# Patient Record
Sex: Male | Born: 1955 | Race: Black or African American | Hispanic: No | Marital: Married | Smoking: Never smoker
Health system: Southern US, Community
[De-identification: ages and names within clinical notes are randomized; demographics above are authoritative.]

## PROBLEM LIST (undated history)

## (undated) DIAGNOSIS — I10 Essential (primary) hypertension: Secondary | ICD-10-CM

## (undated) DIAGNOSIS — E785 Hyperlipidemia, unspecified: Secondary | ICD-10-CM

## (undated) HISTORY — DX: Hyperlipidemia, unspecified: E78.5

## (undated) HISTORY — DX: Essential (primary) hypertension: I10

## (undated) HISTORY — PX: NO PAST SURGERIES: SHX2092

---

## 2004-12-06 ENCOUNTER — Ambulatory Visit: Payer: Self-pay | Admitting: Family Medicine

## 2007-01-27 IMAGING — CR DG LUMBAR SPINE AP/LAT/OBLIQUES W/ FLEX AND EXT
1 series · 5 of 5 positions shown · non-contrast
Comparison: none

REASON FOR EXAM: backache
COMMENTS:

[Series 1: view not recorded · 0.17mm/px · 5 of 5 slices shown]
[im 1/5]
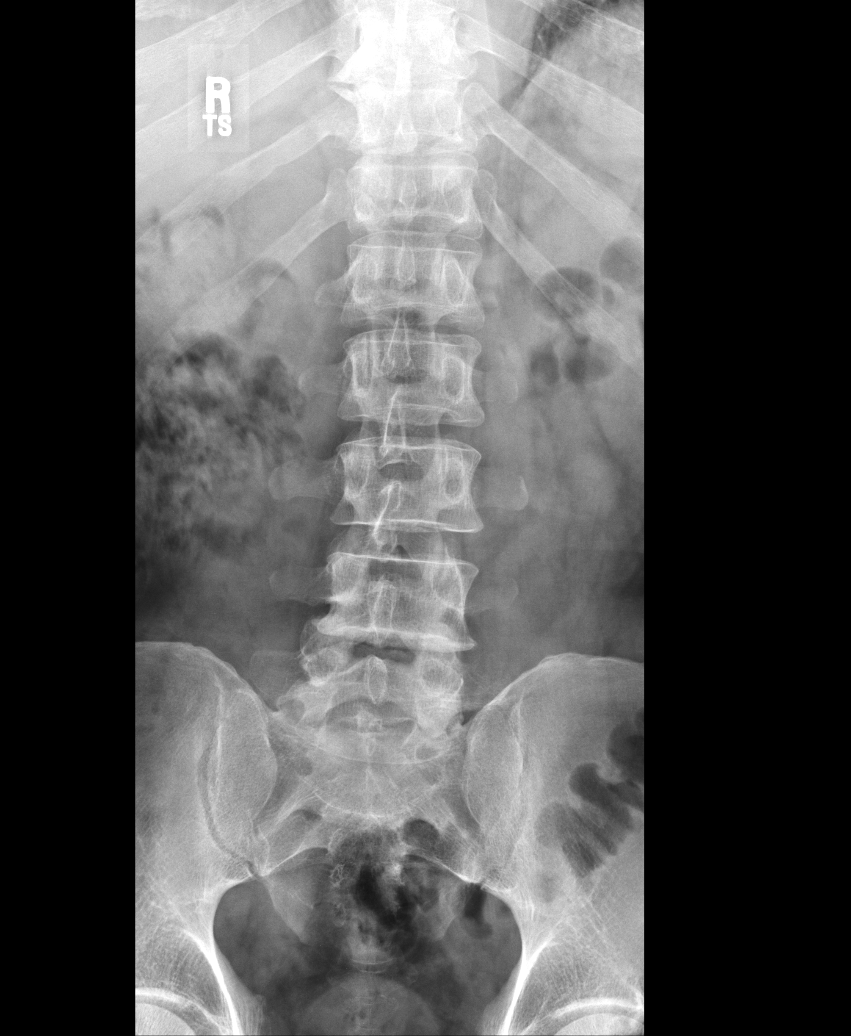
[im 2/5]
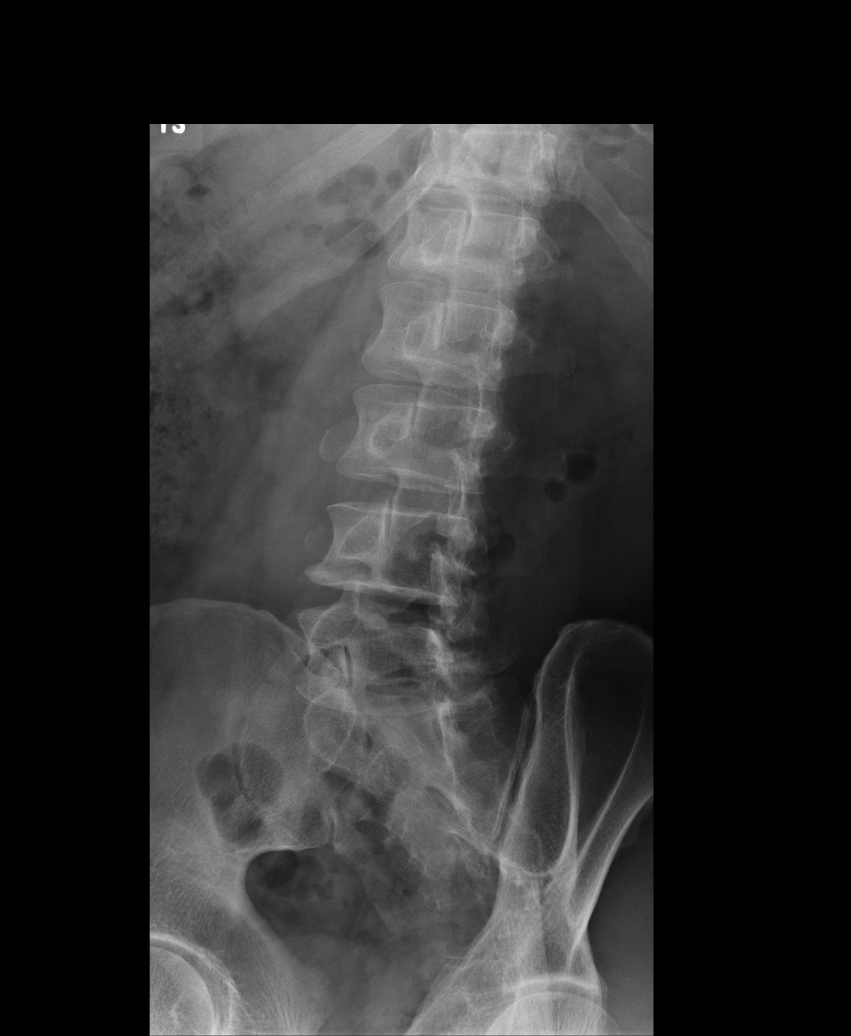
[im 3/5]
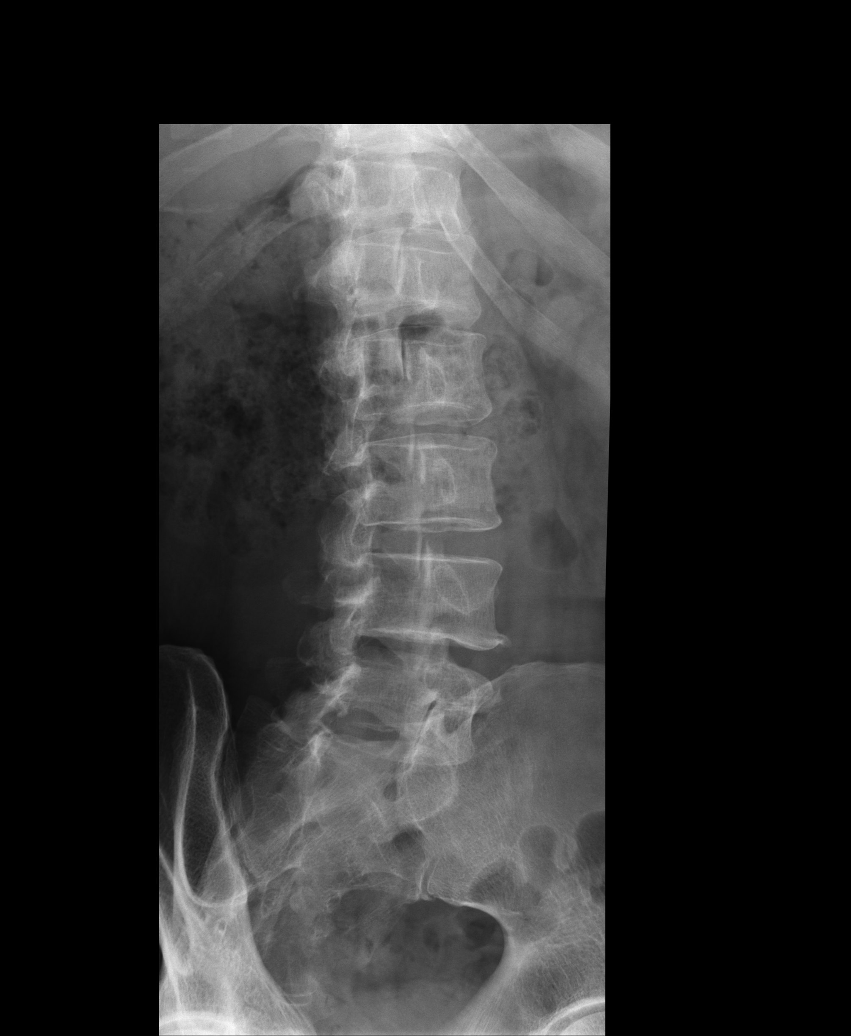
[im 4/5]
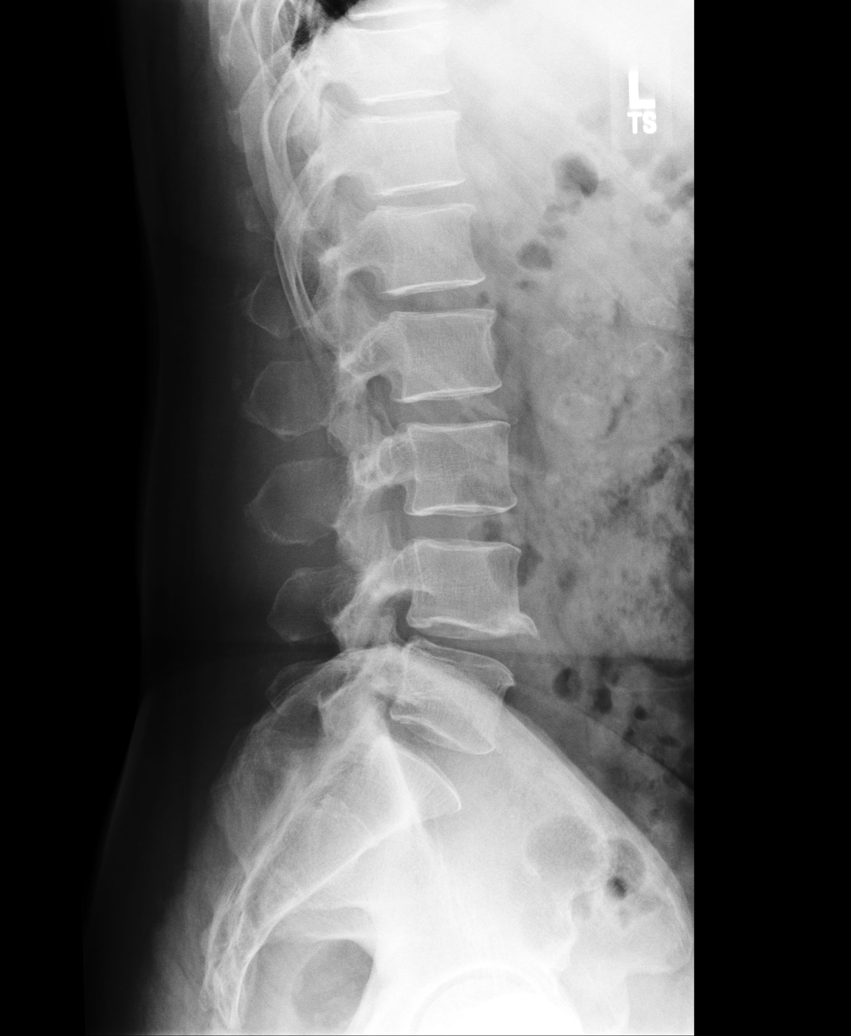
[im 5/5]
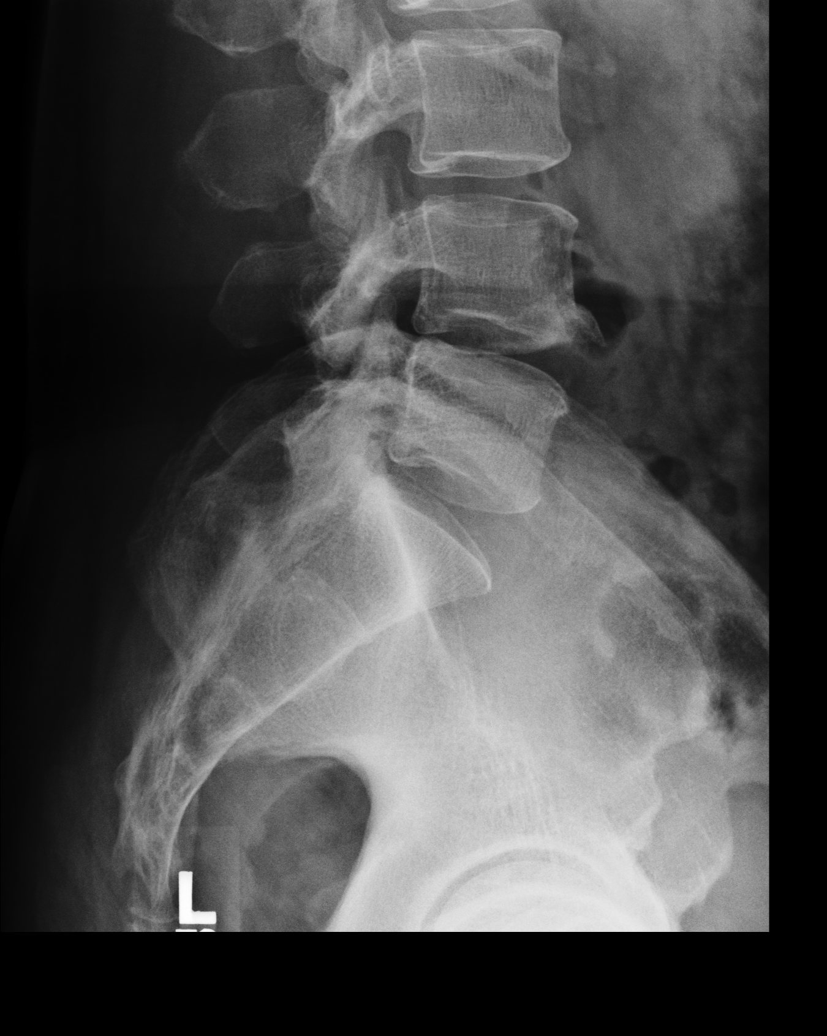

[5 of 5 positions shown; findings below may reference images not displayed]

PROCEDURE:     DXR - DXR LUMBAR SPINE WITH OBLIQUES  - December 06, 2004  [DATE]

RESULT:        AP, lateral and oblique views of the lumbar spine show the
vertebral body heights to be well maintained.  The vertebral body alignment
is normal.  There is mild narrowing of the L4-L5 intervertebral disc space
compatible with disc disease.  Degenerative spurring is noted anteriorly at
L4 and L5.  Oblique view show no significant abnormalities of the articular
facets.  The pedicles are bilaterally intact.
IMPRESSION: 1.     No fracture is seen.
2.     There is narrowing of the L4-L5 intervertebral disc space consistent
with disc disease.  This could be further evaluated by MR if clinically
indicated.

## 2015-05-01 ENCOUNTER — Other Ambulatory Visit: Payer: Self-pay | Admitting: Family Medicine

## 2015-05-02 ENCOUNTER — Encounter: Payer: Self-pay | Admitting: Family Medicine

## 2015-05-02 ENCOUNTER — Ambulatory Visit (INDEPENDENT_AMBULATORY_CARE_PROVIDER_SITE_OTHER): Payer: BLUE CROSS/BLUE SHIELD | Admitting: Family Medicine

## 2015-05-02 VITALS — BP 142/98 | HR 76 | Temp 98.8°F | Resp 18 | Ht 70.0 in | Wt 280.2 lb

## 2015-05-02 DIAGNOSIS — G4733 Obstructive sleep apnea (adult) (pediatric): Secondary | ICD-10-CM | POA: Insufficient documentation

## 2015-05-02 DIAGNOSIS — E785 Hyperlipidemia, unspecified: Secondary | ICD-10-CM

## 2015-05-02 DIAGNOSIS — I10 Essential (primary) hypertension: Secondary | ICD-10-CM

## 2015-05-02 DIAGNOSIS — R7303 Prediabetes: Secondary | ICD-10-CM | POA: Insufficient documentation

## 2015-05-02 DIAGNOSIS — R739 Hyperglycemia, unspecified: Secondary | ICD-10-CM | POA: Diagnosis not present

## 2015-05-02 DIAGNOSIS — Z23 Encounter for immunization: Secondary | ICD-10-CM | POA: Diagnosis not present

## 2015-05-02 LAB — POCT GLYCOSYLATED HEMOGLOBIN (HGB A1C): Hemoglobin A1C: 5.9

## 2015-05-02 LAB — GLUCOSE, POCT (MANUAL RESULT ENTRY): POC GLUCOSE: 93 mg/dL (ref 70–99)

## 2015-05-02 NOTE — Progress Notes (Signed)
10 mgName: Tristan Miller   MRN: [id number]    DOB: 02/02/1956   Date:05/02/2015       Progress Note  Subjective  Chief Complaint  Chief Complaint  Patient presents with  . Hypertension  . Hyperlipidemia    HPI  Hypertension   Patient presents for follow-up of hypertension. It has been present for over 5  years.  Patient states that there is compliance with medical regimen which consists of Hyzaar 100-25 daily . There is no end organ disease. Cardiac risk factors include hypertension hyperlipidemia and diabetes.  Exercise regimen consist of aerobic and weight lifting intermittently .  Diet consist of some salt restriction .  Hyperlipidemia  Patient has a history of hyperlipidemia for over 5 years.  Current medical regimen consist of lovastatin 40 mg daily at bedtime .  Compliance is fair .  Diet and exercise are currently followed intermittently .  Risk factors for cardiovascular disease include hyperlipidemia and hypertension obesity .   There have been no side effects from the medication.     Obstructive sleep apnea  Several years he has had a history of loud snoring and intermittent stopping of his breathing  by his wife's report. He admits to some daytime somnolence difficulty with concentration and headaches. He often gasps awakens himself at night as well.  Past Medical History  Diagnosis Date  . Hyperlipidemia   . Hypertension     Social History  Substance Use Topics  . Smoking status: Never Smoker   . Smokeless tobacco: Not on file  . Alcohol Use: No     Comment: quit 30 years ago     Current outpatient prescriptions:  .  losartan-hydrochlorothiazide (HYZAAR) 100-25 MG tablet, Take 1 tablet by mouth daily., Disp: , Rfl:  .  lovastatin (MEVACOR) 40 MG tablet, Take 40 mg by mouth at bedtime., Disp: , Rfl:   No Known Allergies  Review of Systems  Constitutional: Negative for fever, chills and weight loss.  HENT: Negative for congestion, hearing loss, sore  throat and tinnitus.   Eyes: Negative for blurred vision, double vision and redness.  Respiratory: Negative for cough, hemoptysis and shortness of breath.   Cardiovascular: Negative for chest pain, palpitations, orthopnea, claudication and leg swelling.  Gastrointestinal: Negative for heartburn, nausea, vomiting, diarrhea, constipation and blood in stool.  Genitourinary: Negative for dysuria, urgency, frequency and hematuria.  Musculoskeletal: Negative for myalgias, back pain, joint pain, falls and neck pain.  Skin: Negative for itching.  Neurological: Negative for dizziness, tingling, tremors, focal weakness, seizures, loss of consciousness, weakness and headaches.  Endo/Heme/Allergies: Does not bruise/bleed easily.  Psychiatric/Behavioral: Negative for depression and substance abuse. The patient is not nervous/anxious and does not have insomnia.      Objective  Filed Vitals:   05/02/15 1211  BP: 142/98  Pulse: 76  Temp: 98.8 F (37.1 C)  TempSrc: Oral  Resp: 18  Height: 5\' 10"  (1.778 m)  Weight: 280 lb 3.2 oz (127.098 kg)  SpO2: 95%     Physical Exam  Constitutional: He is oriented to person, place, and time and well-developed, well-nourished, and in no distress.  Obese in no acute distress  HENT:  Head: Normocephalic.  Eyes: EOM are normal. Pupils are equal, round, and reactive to light.  Neck: Normal range of motion. Neck supple. No thyromegaly present.  Cardiovascular: Normal rate, regular rhythm and normal heart sounds.   No murmur heard. Pulmonary/Chest: Effort normal and breath sounds normal. No respiratory distress. He has  no wheezes.  Abdominal: Soft. Bowel sounds are normal.  Musculoskeletal: Normal range of motion. He exhibits edema (1+ in both lower extremities).  Lymphadenopathy:    He has no cervical adenopathy.  Neurological: He is alert and oriented to person, place, and time. No cranial nerve deficit. Gait normal. Coordination normal.  Skin: Skin is warm  and dry. No rash noted.  Psychiatric: Affect and judgment normal.      Assessment & Plan   1. Need for influenza vaccination Given - Flu Vaccine QUAD 36+ mos PF IM (Fluarix & Fluzone Quad PF)  2. OSA (obstructive sleep apnea) Stable - Ambulatory referral to Sleep Studies  3. Hyperglycemia Check glucose and A1c - POCT Glucose (CBG) - POCT HgB A1C  4. Essential hypertension Elevated today. A return visit will increase regimen does not come down with increased exercise and salt restriction - Comprehensive Metabolic Panel (CMET)  5. Hyperlipemia Labs - Comprehensive Metabolic Panel (CMET) - Lipid Profile - TSH

## 2015-05-02 NOTE — Patient Instructions (Signed)
Obesity Obesity is defined as having too much total body fat and a body mass index (BMI) of 30 or more. BMI is an estimate of body fat and is calculated from your height and weight. BMI is typically calculated by your health care provider during regular wellness visits. Obesity happens when you consume more calories than you can burn by exercising or performing daily physical tasks. Prolonged obesity can cause major illnesses or emergencies, such as:  Stroke.  Heart disease.  Diabetes.  Cancer.  Arthritis.  High blood pressure (hypertension).  High cholesterol.  Sleep apnea.  Erectile dysfunction.  Infertility problems. CAUSES   Regularly eating unhealthy foods.  Physical inactivity.  Certain disorders, such as an underactive thyroid (hypothyroidism), Cushing's syndrome, and polycystic ovarian syndrome.  Certain medicines, such as steroids, some depression medicines, and antipsychotics.  Genetics.  Lack of sleep. DIAGNOSIS A health care provider can diagnose obesity after calculating your BMI. Obesity will be diagnosed if your BMI is 30 or higher. There are other methods of measuring obesity levels. Some other methods include measuring your skinfold thickness, your waist circumference, and comparing your hip circumference to your waist circumference. TREATMENT  A healthy treatment program includes some or all of the following:  Long-term dietary changes.  Exercise and physical activity.  Behavioral and lifestyle changes.  Medicine only under the supervision of your health care provider. Medicines may help, but only if they are used with diet and exercise programs. If your BMI is 40 or higher, your health care provider may recommend specialized surgery or programs to help with weight loss. An unhealthy treatment program includes:  Fasting.  Fad diets.  Supplements and drugs. These choices do not succeed in long-term weight control. HOME CARE  INSTRUCTIONS  Exercise and perform physical activity as directed by your health care provider. To increase physical activity, try the following:  Use stairs instead of elevators.  Park farther away from store entrances.  Garden, bike, or walk instead of watching television or using the computer.  Eat healthy, low-calorie foods and drinks on a regular basis. Eat more fruits and vegetables. Use low-calorie cookbooks or take healthy cooking classes.  Limit fast food, sweets, and processed snack foods.  Eat smaller portions.  Keep a daily journal of everything you eat. There are many free websites to help you with this. It may be helpful to measure your foods so you can determine if you are eating the correct portion sizes.  Avoid drinking alcohol. Drink more water and drinks without calories.  Take vitamins and supplements only as recommended by your health care provider.  Weight-loss support groups, registered dietitians, counselors, and stress reduction education can also be very helpful. SEEK IMMEDIATE MEDICAL CARE IF:  You have chest pain or tightness.  You have trouble breathing or feel short of breath.  You have weakness or leg numbness.  You feel confused or have trouble talking.  You have sudden changes in your vision.   This information is not intended to replace advice given to you by your health care provider. Make sure you discuss any questions you have with your health care provider.   Document Released: 07/19/2004 Document Revised: 07/02/2014 Document Reviewed: 07/18/2011 Elsevier Interactive Patient Education 2016 [employer]  

## 2015-05-05 ENCOUNTER — Telehealth: Payer: Self-pay | Admitting: Family Medicine

## 2015-05-05 MED ORDER — LOSARTAN POTASSIUM-HCTZ 100-25 MG PO TABS: 1.0000 | ORAL_TABLET | Freq: Every day | ORAL | Status: DC

## 2015-05-05 NOTE — Telephone Encounter (Signed)
Script sent to pharmacy.

## 2015-05-05 NOTE — Telephone Encounter (Signed)
Kelly from [facility] is requesting a refill on Losartan. Patient was last seen on this past Monday.

## 2015-05-14 LAB — COMPREHENSIVE METABOLIC PANEL
A/G RATIO: 1.3 (ref 1.1–2.5)
ALT: 20 IU/L (ref 0–44)
AST: 23 IU/L (ref 0–40)
Albumin: 3.9 g/dL (ref 3.5–5.5)
Alkaline Phosphatase: 66 IU/L (ref 39–117)
BILIRUBIN TOTAL: 0.3 mg/dL (ref 0.0–1.2)
BUN/Creatinine Ratio: 13 (ref 9–20)
BUN: 13 mg/dL (ref 6–24)
CO2: 25 mmol/L (ref 18–29)
Calcium: 9 mg/dL (ref 8.7–10.2)
Chloride: 98 mmol/L (ref 97–106)
Creatinine, Ser: 1.02 mg/dL (ref 0.76–1.27)
GFR calc Af Amer: 94 mL/min/{1.73_m2} (ref >59)
GFR calc non Af Amer: 81 mL/min/{1.73_m2} (ref >59)
GLOBULIN, TOTAL: 3 g/dL (ref 1.5–4.5)
Glucose: 101 mg/dL — ABNORMAL HIGH (ref 65–99)
POTASSIUM: 3.6 mmol/L (ref 3.5–5.2)
SODIUM: 137 mmol/L (ref 136–144)
TOTAL PROTEIN: 6.9 g/dL (ref 6.0–8.5)

## 2015-05-14 LAB — TSH: TSH: 1.89 u[IU]/mL (ref 0.450–4.500)

## 2015-05-14 LAB — LIPID PANEL
CHOL/HDL RATIO: 4.7 ratio (ref 0.0–5.0)
CHOLESTEROL TOTAL: 211 mg/dL — AB (ref 100–199)
HDL: 45 mg/dL (ref >39)
LDL Calculated: 151 mg/dL — ABNORMAL HIGH (ref 0–99)
Triglycerides: 76 mg/dL (ref 0–149)
VLDL Cholesterol Cal: 15 mg/dL (ref 5–40)

## 2015-05-18 ENCOUNTER — Encounter: Payer: Self-pay | Admitting: Family Medicine

## 2015-05-18 ENCOUNTER — Ambulatory Visit (INDEPENDENT_AMBULATORY_CARE_PROVIDER_SITE_OTHER): Payer: BLUE CROSS/BLUE SHIELD | Admitting: Family Medicine

## 2015-05-18 VITALS — BP 136/76 | HR 86 | Temp 98.5°F | Resp 18 | Ht 70.0 in | Wt 276.7 lb

## 2015-05-18 DIAGNOSIS — Z1211 Encounter for screening for malignant neoplasm of colon: Secondary | ICD-10-CM | POA: Diagnosis not present

## 2015-05-18 DIAGNOSIS — G4733 Obstructive sleep apnea (adult) (pediatric): Secondary | ICD-10-CM | POA: Diagnosis not present

## 2015-05-18 DIAGNOSIS — Z0000 Encounter for general adult medical examination without abnormal findings: Secondary | ICD-10-CM | POA: Diagnosis not present

## 2015-05-18 NOTE — Progress Notes (Signed)
Name: Tristan Miller   MRN: [id number]    DOB: 02/01/1956   Date:05/18/2015       Progress Note  Subjective  Chief Complaint  Chief Complaint  Patient presents with  . Annual Exam    HPI  59 year old presenting for annual H&P. Baseline problems are stable.  Past Medical History  Diagnosis Date  . Hyperlipidemia   . Hypertension     Social History  Substance Use Topics  . Smoking status: Never Smoker   . Smokeless tobacco: Not on file  . Alcohol Use: No     Comment: quit 30 years ago     Current outpatient prescriptions:  .  losartan-hydrochlorothiazide (HYZAAR) 100-25 MG tablet, Take 1 tablet by mouth daily., Disp: 30 tablet, Rfl: 5 .  lovastatin (MEVACOR) 40 MG tablet, Take 40 mg by mouth at bedtime., Disp: , Rfl:   No Known Allergies  Review of Systems  Constitutional: Negative for fever, chills and weight loss.  HENT: Negative for congestion, hearing loss, sore throat and tinnitus.   Eyes: Negative for blurred vision, double vision and redness.  Respiratory: Negative for cough, hemoptysis and shortness of breath.   Cardiovascular: Negative for chest pain, palpitations, orthopnea, claudication and leg swelling.  Gastrointestinal: Negative for heartburn, nausea, vomiting, diarrhea, constipation and blood in stool.  Genitourinary: Negative for dysuria, urgency, frequency and hematuria.  Musculoskeletal: Negative for myalgias, back pain, joint pain, falls and neck pain.  Skin: Negative for itching.  Neurological: Negative for dizziness, tingling, tremors, focal weakness, seizures, loss of consciousness, weakness and headaches.  Endo/Heme/Allergies: Does not bruise/bleed easily.  Psychiatric/Behavioral: Negative for depression and substance abuse. The patient is not nervous/anxious and does not have insomnia.      Objective  Filed Vitals:   05/18/15 1157  BP: 136/76  Pulse: 86  Temp: 98.5 F (36.9 C)  TempSrc: Oral  Resp: 18  Height: 5\' 10"  (1.778 m)   Weight: 276 lb 11.2 oz (125.51 kg)  SpO2: 97%     Physical Exam  Constitutional: He is oriented to person, place, and time.  Obese and in no acute distress  HENT:  Head: Normocephalic.  Eyes: EOM are normal. Pupils are equal, round, and reactive to light.  Neck: Normal range of motion. Neck supple. No thyromegaly present.  Cardiovascular: Normal rate, regular rhythm and normal heart sounds.   No murmur heard. Pulmonary/Chest: Effort normal and breath sounds normal. No respiratory distress. He has no wheezes.  Abdominal: Soft. Bowel sounds are normal.  Genitourinary: Rectum normal, prostate normal and penis normal. Guaiac negative stool. No discharge found.  Musculoskeletal: Normal range of motion. He exhibits no edema.  Lymphadenopathy:    He has no cervical adenopathy.  Neurological: He is alert and oriented to person, place, and time. No cranial nerve deficit. Gait normal. Coordination normal.  Skin: Skin is warm and dry. No rash noted.  Psychiatric: Affect and judgment normal.      Assessment & Plan   1. Annual physical exam - CBC with Differential/Platelet - Comprehensive metabolic panel - Lipid panel - PSA - TSH - POC Hemoccult Bld/Stl (1-Cd Office Dx)  2. OSA (obstructive sleep apnea) - Ambulatory referral to Sleep Studies  3. Colon cancer screening  - Ambulatory referral to Colorectal Surgery

## 2015-10-03 ENCOUNTER — Other Ambulatory Visit: Payer: Self-pay | Admitting: Family Medicine

## 2015-11-30 ENCOUNTER — Other Ambulatory Visit: Payer: Self-pay | Admitting: Family Medicine

## 2016-01-16 ENCOUNTER — Ambulatory Visit (INDEPENDENT_AMBULATORY_CARE_PROVIDER_SITE_OTHER): Payer: BLUE CROSS/BLUE SHIELD | Admitting: Family Medicine

## 2016-01-16 ENCOUNTER — Encounter: Payer: Self-pay | Admitting: Family Medicine

## 2016-01-16 VITALS — BP 138/76 | HR 81 | Temp 98.5°F | Resp 18 | Ht 70.0 in | Wt 284.4 lb

## 2016-01-16 DIAGNOSIS — E785 Hyperlipidemia, unspecified: Secondary | ICD-10-CM | POA: Diagnosis not present

## 2016-01-16 DIAGNOSIS — I10 Essential (primary) hypertension: Secondary | ICD-10-CM | POA: Diagnosis not present

## 2016-01-16 LAB — COMPREHENSIVE METABOLIC PANEL
ALK PHOS: 69 U/L (ref 40–115)
ALT: 30 U/L (ref 9–46)
AST: 20 U/L (ref 10–35)
Albumin: 3.9 g/dL (ref 3.6–5.1)
BUN: 13 mg/dL (ref 7–25)
CALCIUM: 9 mg/dL (ref 8.6–10.3)
CO2: 26 mmol/L (ref 20–31)
Chloride: 103 mmol/L (ref 98–110)
Creat: 0.99 mg/dL (ref 0.70–1.33)
GLUCOSE: 107 mg/dL — AB (ref 65–99)
POTASSIUM: 3.8 mmol/L (ref 3.5–5.3)
Sodium: 137 mmol/L (ref 135–146)
TOTAL PROTEIN: 6.9 g/dL (ref 6.1–8.1)
Total Bilirubin: 0.4 mg/dL (ref 0.2–1.2)

## 2016-01-16 LAB — LIPID PANEL
CHOL/HDL RATIO: 4.3 ratio (ref <=5.0)
CHOLESTEROL: 223 mg/dL — AB (ref 125–200)
HDL: 52 mg/dL (ref >=40)
LDL Cholesterol: 151 mg/dL — ABNORMAL HIGH (ref <130)
Triglycerides: 102 mg/dL (ref <150)
VLDL: 20 mg/dL (ref <30)

## 2016-01-16 MED ORDER — LOSARTAN POTASSIUM-HCTZ 100-25 MG PO TABS: 1.0000 | ORAL_TABLET | Freq: Every day | ORAL | 0 refills | Status: DC

## 2016-01-16 MED ORDER — LOVASTATIN 40 MG PO TABS: 40.0000 mg | ORAL_TABLET | Freq: Every day | ORAL | 0 refills | Status: DC

## 2016-01-16 NOTE — Progress Notes (Signed)
Name: Tristan Miller   MRN: [id number]    DOB: 05-19-1956   Date:01/16/2016       Progress Note  Subjective  Chief Complaint  Chief Complaint  Patient presents with  . Hyperlipidemia    follow up, medication refills  . Hypertension    Hyperlipidemia  This is a chronic problem. The current episode started more than 1 year ago. The problem is uncontrolled. Exacerbating diseases include obesity. He has no history of diabetes. Pertinent negatives include no chest pain, leg pain, myalgias or shortness of breath. Current antihyperlipidemic treatment includes statins.  Hypertension  This is a chronic problem. The problem is controlled. Pertinent negatives include no blurred vision, chest pain, headaches, palpitations or shortness of breath. Past treatments include angiotensin blockers and diuretics.    Past Medical History:  Diagnosis Date  . Hyperlipidemia   . Hypertension     Past Surgical History:  Procedure Laterality Date  . NO PAST SURGERIES      No family history on file.  Social History   Social History  . Marital status: Married    Spouse name: N/A  . Number of children: N/A  . Years of education: N/A   Occupational History  . Not on file.   Social History Main Topics  . Smoking status: Never Smoker  . Smokeless tobacco: Not on file  . Alcohol use No     Comment: quit 30 years ago  . Drug use: No  . Sexual activity: Yes    Partners: Female   Other Topics Concern  . Not on file   Social History Narrative  . No narrative on file     Current Outpatient Prescriptions:  .  losartan-hydrochlorothiazide (HYZAAR) 100-25 MG tablet, TAKE 1 TABLET BY MOUTH DAILY., Disp: 30 tablet, Rfl: 0 .  lovastatin (MEVACOR) 40 MG tablet, TAKE 1 TABLET BY MOUTH AT BEDTIME, Disp: 90 tablet, Rfl: 0  No Known Allergies   Review of Systems  Eyes: Negative for blurred vision.  Respiratory: Negative for shortness of breath.   Cardiovascular: Negative for chest pain and  palpitations.  Musculoskeletal: Negative for myalgias.  Neurological: Negative for headaches.    Objective  Vitals:   01/16/16 0944  BP: 138/76  Pulse: 81  Resp: 18  Temp: 98.5 F (36.9 C)  TempSrc: Oral  SpO2: 94%  Weight: 284 lb 6.4 oz (129 kg)  Height: 5\' 10"  (1.778 m)    Physical Exam  Constitutional: He is oriented to person, place, and time and well-developed, well-nourished, and in no distress.  HENT:  Head: Normocephalic and atraumatic.  Cardiovascular: Normal rate, regular rhythm and normal heart sounds.   No murmur heard. Pulmonary/Chest: Effort normal and breath sounds normal. He has no wheezes.  Abdominal: Soft. Bowel sounds are normal. There is no tenderness.  Neurological: He is alert and oriented to person, place, and time.  Psychiatric: Mood, memory, affect and judgment normal.  Nursing note and vitals reviewed.      Assessment & Plan  1. Essential hypertension  - losartan-hydrochlorothiazide (HYZAAR) 100-25 MG tablet; Take 1 tablet by mouth daily.  Dispense: 90 tablet; Refill: 0  2. Hyperlipidemia Has taken Lovastatin for 30 days in the last 3 months, recheck FLP today. - Comprehensive Metabolic Panel (CMET) - Lipid Profile - lovastatin (MEVACOR) 40 MG tablet; Take 1 tablet (40 mg total) by mouth at bedtime.  Dispense: 90 tablet; Refill: 0   Landyn Lorincz [facility] 01/16/2016 10:05  AM

## 2016-01-18 NOTE — Progress Notes (Signed)
Patient notified of lab results

## 2016-04-12 ENCOUNTER — Other Ambulatory Visit: Payer: Self-pay | Admitting: Family Medicine

## 2016-04-12 DIAGNOSIS — I10 Essential (primary) hypertension: Secondary | ICD-10-CM

## 2016-04-17 ENCOUNTER — Encounter: Payer: Self-pay | Admitting: Family Medicine

## 2016-04-17 ENCOUNTER — Ambulatory Visit (INDEPENDENT_AMBULATORY_CARE_PROVIDER_SITE_OTHER): Payer: Self-pay | Admitting: Family Medicine

## 2016-04-17 DIAGNOSIS — R7303 Prediabetes: Secondary | ICD-10-CM | POA: Insufficient documentation

## 2016-04-17 DIAGNOSIS — E785 Hyperlipidemia, unspecified: Secondary | ICD-10-CM

## 2016-04-17 DIAGNOSIS — I10 Essential (primary) hypertension: Secondary | ICD-10-CM

## 2016-04-17 LAB — LIPID PANEL
CHOLESTEROL: 174 mg/dL (ref 125–200)
HDL: 46 mg/dL (ref >=40)
LDL CALC: 100 mg/dL (ref <130)
TRIGLYCERIDES: 138 mg/dL (ref <150)
Total CHOL/HDL Ratio: 3.8 Ratio (ref <=5.0)
VLDL: 28 mg/dL (ref <30)

## 2016-04-17 LAB — POCT GLYCOSYLATED HEMOGLOBIN (HGB A1C): Hemoglobin A1C: 5.9

## 2016-04-17 MED ORDER — LOSARTAN POTASSIUM-HCTZ 100-25 MG PO TABS: 1.0000 | ORAL_TABLET | Freq: Every day | ORAL | 0 refills | Status: DC

## 2016-04-17 MED ORDER — LOVASTATIN 40 MG PO TABS
40.0000 mg | ORAL_TABLET | Freq: Every day | ORAL | 0 refills | Status: DC
Start: 2016-04-17 — End: 2016-10-31

## 2016-04-17 NOTE — Progress Notes (Signed)
Name: Tristan Miller   MRN: [id number]    DOB: 07/18/1955   Date:04/17/2016       Progress Note  Subjective  Chief Complaint  Chief Complaint  Patient presents with  . Follow-up    3 mo  . Medication Refill  . Labs Only    Fasting    Hyperlipidemia  This is a chronic problem. The current episode started more than 1 year ago. The problem is uncontrolled. Exacerbating diseases include obesity. He has no history of diabetes. Associated symptoms include myalgias (feels muscle aches since being on Lovastatin). Pertinent negatives include no chest pain, leg pain or shortness of breath. Current antihyperlipidemic treatment includes statins. Compliance problems include medication side effects (Pt. was not taking Lovastatin prior to last lab draw).   Hypertension  This is a chronic problem. The problem is unchanged. The problem is controlled. Pertinent negatives include no blurred vision, chest pain, headaches, malaise/fatigue, palpitations or shortness of breath. Past treatments include angiotensin blockers and diuretics.     Past Medical History:  Diagnosis Date  . Hyperlipidemia   . Hypertension     Past Surgical History:  Procedure Laterality Date  . NO PAST SURGERIES      History reviewed. No pertinent family history.  Social History   Social History  . Marital status: Married    Spouse name: N/A  . Number of children: N/A  . Years of education: N/A   Occupational History  . Not on file.   Social History Main Topics  . Smoking status: Never Smoker  . Smokeless tobacco: Never Used  . Alcohol use No     Comment: quit 30 years ago  . Drug use: No  . Sexual activity: Yes    Partners: Female   Other Topics Concern  . Not on file   Social History Narrative  . No narrative on file    Current Outpatient Prescriptions:  .  losartan-hydrochlorothiazide (HYZAAR) 100-25 MG tablet, Take 1 tablet by mouth daily., Disp: 90 tablet, Rfl: 0 .  lovastatin (MEVACOR) 40 MG  tablet, Take 1 tablet (40 mg total) by mouth at bedtime., Disp: 90 tablet, Rfl: 0  No Known Allergies   Review of Systems  Constitutional: Negative for chills, fever and malaise/fatigue.  Eyes: Negative for blurred vision.  Respiratory: Negative for shortness of breath.   Cardiovascular: Negative for chest pain and palpitations.  Musculoskeletal: Positive for myalgias (feels muscle aches since being on Lovastatin).  Neurological: Negative for headaches.    Objective  Vitals:   04/17/16 0942  BP: 136/80  Pulse: 76  Resp: 17  Temp: 97.9 F (36.6 C)  TempSrc: Oral  SpO2: 95%  Weight: 290 lb 14.4 oz (132 kg)  Height: 5\' 10"  (1.778 m)    Physical Exam  Constitutional: He is oriented to person, place, and time and well-developed, well-nourished, and in no distress.  HENT:  Head: Normocephalic and atraumatic.  Cardiovascular: Normal rate, regular rhythm and normal heart sounds.   No murmur heard. Pulmonary/Chest: Effort normal and breath sounds normal. He has no wheezes.  Abdominal: Soft. Bowel sounds are normal. There is no tenderness.  Musculoskeletal: He exhibits no edema.  Neurological: He is alert and oriented to person, place, and time.  Psychiatric: Mood, memory, affect and judgment normal.  Nursing note and vitals reviewed.    Assessment & Plan  1. Essential hypertension BP stable and controlled on present therapy - losartan-hydrochlorothiazide (HYZAAR) 100-25 MG tablet; Take 1 tablet by mouth daily.  Dispense: 90 tablet; Refill: 0  2. Hyperlipidemia, unspecified hyperlipidemia type On lovastatin, recheck FLP - lovastatin (MEVACOR) 40 MG tablet; Take 1 tablet (40 mg total) by mouth at bedtime.  Dispense: 90 tablet; Refill: 0 - Lipid Profile  3. Pre-diabetes No change in point-of-care A1c at  5.9% - POCT HgB A1C  Tristan Miller [facility] 04/17/2016 9:47 AM

## 2016-05-21 ENCOUNTER — Encounter: Payer: BLUE CROSS/BLUE SHIELD | Admitting: Family Medicine

## 2016-05-29 ENCOUNTER — Encounter: Payer: BLUE CROSS/BLUE SHIELD | Admitting: Family Medicine

## 2016-07-17 ENCOUNTER — Encounter: Payer: Self-pay | Admitting: Family Medicine

## 2016-07-17 ENCOUNTER — Ambulatory Visit (INDEPENDENT_AMBULATORY_CARE_PROVIDER_SITE_OTHER): Payer: BC Managed Care – PPO | Admitting: Family Medicine

## 2016-07-17 DIAGNOSIS — Z0000 Encounter for general adult medical examination without abnormal findings: Secondary | ICD-10-CM | POA: Diagnosis not present

## 2016-07-17 NOTE — Progress Notes (Signed)
Name: Tristan Miller   MRN: [id number]    DOB: 01/24/1956   Date:07/17/2016       Progress Note  Subjective  Chief Complaint  Chief Complaint  Patient presents with  . Annual Exam    CPE    HPI  Pt. Presents for a complete physical exam. He never had a colonoscopy, would like to try Cologuard.  Last prostate exam was about 2 years ago.   Past Medical History:  Diagnosis Date  . Hyperlipidemia   . Hypertension     Past Surgical History:  Procedure Laterality Date  . NO PAST SURGERIES      History reviewed. No pertinent family history.  Social History   Social History  . Marital status: Married    Spouse name: N/A  . Number of children: N/A  . Years of education: N/A   Occupational History  . Not on file.   Social History Main Topics  . Smoking status: Never Smoker  . Smokeless tobacco: Never Used  . Alcohol use No     Comment: quit 30 years ago  . Drug use: No  . Sexual activity: Yes    Partners: Female   Other Topics Concern  . Not on file   Social History Narrative  . No narrative on file     Current Outpatient Prescriptions:  .  losartan-hydrochlorothiazide (HYZAAR) 100-25 MG tablet, Take 1 tablet by mouth daily., Disp: 90 tablet, Rfl: 0 .  lovastatin (MEVACOR) 40 MG tablet, Take 1 tablet (40 mg total) by mouth at bedtime., Disp: 90 tablet, Rfl: 0  No Known Allergies   Review of Systems  Constitutional: Negative for chills, fever, malaise/fatigue and weight loss.  HENT: Negative for congestion, sinus pain and sore throat.   Eyes: Negative for blurred vision and double vision.  Respiratory: Negative for cough, sputum production and shortness of breath.   Cardiovascular: Negative for chest pain and leg swelling.  Gastrointestinal: Negative for abdominal pain, blood in stool, constipation and diarrhea.  Genitourinary: Negative for dysuria and hematuria.  Musculoskeletal: Negative for back pain (occasional low back pain) and neck pain.   Neurological: Negative for dizziness and headaches.  Psychiatric/Behavioral: Negative for depression. The patient is not nervous/anxious and does not have insomnia.       Objective  Vitals:   07/17/16 1135  BP: 132/73  Pulse: 88  Resp: 16  Temp: 98.6 F (37 C)  TempSrc: Oral  SpO2: 96%  Weight: 287 lb 6.4 oz (130.4 kg)  Height: 5\' 10"  (1.778 m)    Physical Exam  Constitutional: He is oriented to person, place, and time and well-developed, well-nourished, and in no distress.  HENT:  Head: Normocephalic and atraumatic.  Right Ear: Tympanic membrane and ear canal normal. No drainage or swelling.  Left Ear: Tympanic membrane and ear canal normal. No drainage or swelling.  Mouth/Throat: No posterior oropharyngeal erythema.  Cardiovascular: Normal rate, regular rhythm, S1 normal, S2 normal and normal heart sounds.   No murmur heard. Pulmonary/Chest: Effort normal and breath sounds normal. He has no wheezes.  Abdominal: Soft. Bowel sounds are normal. There is no tenderness.  Genitourinary: Prostate normal.  Neurological: He is alert and oriented to person, place, and time.  Psychiatric: Mood, memory, affect and judgment normal.  Nursing note and vitals reviewed.      Assessment & Plan  1. Annual physical exam Obtain age-appropriate laboratory screenings, ordered Cologuard for colon cancer screening - CBC with Differential - COMPLETE METABOLIC PANEL WITH GFR -  PSA - TSH - Vitamin D (25 hydroxy) - Cologuard   [name] [facility] 07/17/2016 11:52 AM

## 2016-07-18 LAB — COMPLETE METABOLIC PANEL WITH GFR
ALT: 27 U/L (ref 9–46)
AST: 23 U/L (ref 10–35)
Albumin: 3.8 g/dL (ref 3.6–5.1)
Alkaline Phosphatase: 60 U/L (ref 40–115)
BUN: 15 mg/dL (ref 7–25)
CHLORIDE: 102 mmol/L (ref 98–110)
CO2: 27 mmol/L (ref 20–31)
CREATININE: 0.99 mg/dL (ref 0.70–1.33)
Calcium: 9.4 mg/dL (ref 8.6–10.3)
GFR, Est African American: >89 mL/min (ref >=60)
GFR, Est Non African American: 84 mL/min (ref >=60)
GLUCOSE: 97 mg/dL (ref 65–99)
POTASSIUM: 4.2 mmol/L (ref 3.5–5.3)
SODIUM: 138 mmol/L (ref 135–146)
Total Bilirubin: 0.6 mg/dL (ref 0.2–1.2)
Total Protein: 7.1 g/dL (ref 6.1–8.1)

## 2016-07-18 LAB — CBC WITH DIFFERENTIAL/PLATELET
Basophils Absolute: 0 cells/uL (ref 0–200)
Basophils Relative: 0 %
EOS ABS: 60 {cells}/uL (ref 15–500)
Eosinophils Relative: 1 %
HEMATOCRIT: 44.1 % (ref 38.5–50.0)
HEMOGLOBIN: 14.4 g/dL (ref 13.2–17.1)
LYMPHS ABS: 2100 {cells}/uL (ref 850–3900)
Lymphocytes Relative: 35 %
MCH: 26.6 pg — ABNORMAL LOW (ref 27.0–33.0)
MCHC: 32.7 g/dL (ref 32.0–36.0)
MCV: 81.5 fL (ref 80.0–100.0)
MPV: 10.6 fL (ref 7.5–12.5)
Monocytes Absolute: 540 cells/uL (ref 200–950)
Monocytes Relative: 9 %
NEUTROS PCT: 55 %
Neutro Abs: 3300 cells/uL (ref 1500–7800)
Platelets: 233 10*3/uL (ref 140–400)
RBC: 5.41 MIL/uL (ref 4.20–5.80)
RDW: 14.4 % (ref 11.0–15.0)
WBC: 6 10*3/uL (ref 3.8–10.8)

## 2016-07-18 LAB — PSA: PSA: 1.3 ng/mL (ref <=4.0)

## 2016-07-18 LAB — VITAMIN D 25 HYDROXY (VIT D DEFICIENCY, FRACTURES): Vit D, 25-Hydroxy: 35 ng/mL (ref 30–100)

## 2016-07-18 LAB — TSH: TSH: 1.89 mIU/L (ref 0.40–4.50)

## 2016-07-25 ENCOUNTER — Other Ambulatory Visit: Payer: Self-pay | Admitting: Family Medicine

## 2016-07-25 DIAGNOSIS — I10 Essential (primary) hypertension: Secondary | ICD-10-CM

## 2016-10-16 ENCOUNTER — Ambulatory Visit: Payer: BC Managed Care – PPO | Admitting: Family Medicine

## 2016-10-21 ENCOUNTER — Other Ambulatory Visit: Payer: Self-pay | Admitting: Family Medicine

## 2016-10-21 DIAGNOSIS — I10 Essential (primary) hypertension: Secondary | ICD-10-CM

## 2016-10-31 ENCOUNTER — Ambulatory Visit (INDEPENDENT_AMBULATORY_CARE_PROVIDER_SITE_OTHER): Payer: BC Managed Care – PPO | Admitting: Family Medicine

## 2016-10-31 ENCOUNTER — Encounter: Payer: Self-pay | Admitting: Family Medicine

## 2016-10-31 VITALS — BP 129/80 | HR 77 | Temp 97.3°F | Resp 16 | Ht 70.0 in | Wt 288.1 lb

## 2016-10-31 DIAGNOSIS — I10 Essential (primary) hypertension: Secondary | ICD-10-CM

## 2016-10-31 DIAGNOSIS — M791 Myalgia, unspecified site: Principal | ICD-10-CM

## 2016-10-31 DIAGNOSIS — Z1211 Encounter for screening for malignant neoplasm of colon: Secondary | ICD-10-CM | POA: Insufficient documentation

## 2016-10-31 DIAGNOSIS — E785 Hyperlipidemia, unspecified: Secondary | ICD-10-CM | POA: Diagnosis not present

## 2016-10-31 DIAGNOSIS — T466X5A Adverse effect of antihyperlipidemic and antiarteriosclerotic drugs, initial encounter: Secondary | ICD-10-CM

## 2016-10-31 LAB — COMPLETE METABOLIC PANEL WITH GFR
ALBUMIN: 4 g/dL (ref 3.6–5.1)
ALT: 26 U/L (ref 9–46)
AST: 21 U/L (ref 10–35)
Alkaline Phosphatase: 66 U/L (ref 40–115)
BUN: 14 mg/dL (ref 7–25)
CALCIUM: 9 mg/dL (ref 8.6–10.3)
CHLORIDE: 104 mmol/L (ref 98–110)
CO2: 29 mmol/L (ref 20–31)
Creat: 1.11 mg/dL (ref 0.70–1.33)
GFR, EST NON AFRICAN AMERICAN: 72 mL/min (ref >=60)
GFR, Est African American: 84 mL/min (ref >=60)
Glucose, Bld: 107 mg/dL — ABNORMAL HIGH (ref 65–99)
POTASSIUM: 3.8 mmol/L (ref 3.5–5.3)
Sodium: 141 mmol/L (ref 135–146)
Total Bilirubin: 0.4 mg/dL (ref 0.2–1.2)
Total Protein: 7 g/dL (ref 6.1–8.1)

## 2016-10-31 LAB — LIPID PANEL
CHOL/HDL RATIO: 3.9 ratio (ref <5.0)
CHOLESTEROL: 177 mg/dL (ref <200)
HDL: 45 mg/dL (ref >40)
LDL Cholesterol: 116 mg/dL — ABNORMAL HIGH (ref <100)
TRIGLYCERIDES: 79 mg/dL (ref <150)
VLDL: 16 mg/dL (ref <30)

## 2016-10-31 MED ORDER — LOSARTAN POTASSIUM-HCTZ 100-25 MG PO TABS: 1.0000 | ORAL_TABLET | Freq: Every day | ORAL | 1 refills | Status: DC

## 2016-10-31 MED ORDER — LOVASTATIN 40 MG PO TABS: 40.0000 mg | ORAL_TABLET | Freq: Every day | ORAL | 1 refills | Status: DC

## 2016-10-31 NOTE — Progress Notes (Signed)
Name: Tristan Miller   MRN: [id number]    DOB: 12/22/1955   Date:10/31/2016       Progress Note  Subjective  Chief Complaint  Chief Complaint  Patient presents with  . Follow-up    3 mo  . Medication Refill    Hyperlipidemia  This is a chronic problem. The current episode started more than 1 year ago. The problem is uncontrolled. Exacerbating diseases include obesity. He has no history of diabetes. Associated symptoms include myalgias (feels muscle aches since being on Lovastatin, improved but still has occasional episodes). Pertinent negatives include no chest pain, leg pain or shortness of breath. Current antihyperlipidemic treatment includes statins. Compliance problems include medication side effects (Pt. was not taking Lovastatin prior to last lab draw).   Hypertension  This is a chronic problem. The problem is unchanged. The problem is controlled. Pertinent negatives include no blurred vision, chest pain, headaches, malaise/fatigue, palpitations or shortness of breath. Past treatments include angiotensin blockers and diuretics.    Past Medical History:  Diagnosis Date  . Hyperlipidemia   . Hypertension     Past Surgical History:  Procedure Laterality Date  . NO PAST SURGERIES      History reviewed. No pertinent family history.  Social History   Social History  . Marital status: Married    Spouse name: N/A  . Number of children: N/A  . Years of education: N/A   Occupational History  . Not on file.   Social History Main Topics  . Smoking status: Never Smoker  . Smokeless tobacco: Never Used  . Alcohol use No     Comment: quit 30 years ago  . Drug use: No  . Sexual activity: Yes    Partners: Female   Other Topics Concern  . Not on file   Social History Narrative  . No narrative on file     Current Outpatient Prescriptions:  .  losartan-hydrochlorothiazide (HYZAAR) 100-25 MG tablet, TAKE 1 TABLET BY MOUTH DAILY., Disp: 90 tablet, Rfl: 0 .  lovastatin  (MEVACOR) 40 MG tablet, Take 1 tablet (40 mg total) by mouth at bedtime., Disp: 90 tablet, Rfl: 0  No Known Allergies   Review of Systems  Constitutional: Negative for malaise/fatigue.  Eyes: Negative for blurred vision.  Respiratory: Negative for shortness of breath.   Cardiovascular: Negative for chest pain and palpitations.  Musculoskeletal: Positive for myalgias (feels muscle aches since being on Lovastatin, improved but still has occasional episodes).  Neurological: Negative for headaches.      Objective  Vitals:   10/31/16 0952  BP: 129/80  Pulse: 77  Resp: 16  Temp: 97.3 F (36.3 C)  TempSrc: Oral  SpO2: 96%  Weight: 288 lb 1.6 oz (130.7 kg)  Height: 5\' 10"  (1.778 m)    Physical Exam  Constitutional: He is oriented to person, place, and time and well-developed, well-nourished, and in no distress.  HENT:  Head: Normocephalic and atraumatic.  Cardiovascular: Normal rate, regular rhythm and normal heart sounds.   No murmur heard. Pulmonary/Chest: Effort normal and breath sounds normal. He has no wheezes.  Abdominal: Soft. Bowel sounds are normal. There is no tenderness.  Musculoskeletal: He exhibits no edema.  Neurological: He is alert and oriented to person, place, and time.  Psychiatric: Mood, memory, affect and judgment normal.  Nursing note and vitals reviewed.     Assessment & Plan  1. Essential hypertension BP stable and controlled on present therapy - losartan-hydrochlorothiazide (HYZAAR) 100-25 MG tablet; Take 1 tablet  by mouth daily.  Dispense: 90 tablet; Refill: 1  2. Hyperlipidemia, unspecified hyperlipidemia type Stable, FLP at goal - lovastatin (MEVACOR) 40 MG tablet; Take 1 tablet (40 mg total) by mouth at bedtime.  Dispense: 90 tablet; Refill: 1  3. Myalgia due to statin Will obtain lipid panel, advised patient to use CoQ10 supplement, which is proven beneficial in patients with statin-induced myalgias - Lipid panel - COMPLETE METABOLIC  PANEL WITH GFR  4. Screening for colon cancer Cologuard was denied, we'll refer to Dr. Servando Snare for colonoscopy - Ambulatory referral to Gastroenterology   Hazel Sams [facility] 10/31/2016 10:01 AM

## 2016-11-09 ENCOUNTER — Ambulatory Visit (INDEPENDENT_AMBULATORY_CARE_PROVIDER_SITE_OTHER): Payer: BC Managed Care – PPO | Admitting: Family Medicine

## 2016-11-09 ENCOUNTER — Encounter: Payer: Self-pay | Admitting: Family Medicine

## 2016-11-09 VITALS — BP 140/80 | HR 75 | Temp 98.1°F | Resp 18 | Ht 70.0 in | Wt 288.0 lb

## 2016-11-09 DIAGNOSIS — J0100 Acute maxillary sinusitis, unspecified: Secondary | ICD-10-CM

## 2016-11-09 MED ORDER — AMOXICILLIN-POT CLAVULANATE 875-125 MG PO TABS: 1.0000 | ORAL_TABLET | Freq: Two times a day (BID) | ORAL | 0 refills | Status: AC

## 2016-11-09 MED ORDER — MOMETASONE FUROATE 50 MCG/ACT NA SUSP: 2.0000 | Freq: Every day | NASAL | 0 refills | Status: DC

## 2016-11-09 NOTE — Progress Notes (Signed)
Name: Tristan Miller   MRN: [id number]    DOB: June 03, 1956   Date:11/09/2016       Progress Note  Subjective  Chief Complaint  Chief Complaint  Patient presents with  . URI    sinus pressure, cough, congested for 1 week    URI   This is a new problem. The current episode started in the past 7 days. There has been no fever. Associated symptoms include congestion, coughing, headaches (now resolved) and sinus pain. Pertinent negatives include no ear pain or sore throat (initially had sore throat for one day, now resolved). He has tried decongestant for the symptoms.     Past Medical History:  Diagnosis Date  . Hyperlipidemia   . Hypertension     Past Surgical History:  Procedure Laterality Date  . NO PAST SURGERIES      No family history on file.  Social History   Social History  . Marital status: Married    Spouse name: N/A  . Number of children: N/A  . Years of education: N/A   Occupational History  . Not on file.   Social History Main Topics  . Smoking status: Never Smoker  . Smokeless tobacco: Never Used  . Alcohol use No     Comment: quit 30 years ago  . Drug use: No  . Sexual activity: Yes    Partners: Female   Other Topics Concern  . Not on file   Social History Narrative  . No narrative on file     Current Outpatient Prescriptions:  .  losartan-hydrochlorothiazide (HYZAAR) 100-25 MG tablet, Take 1 tablet by mouth daily., Disp: 90 tablet, Rfl: 1 .  lovastatin (MEVACOR) 40 MG tablet, Take 1 tablet (40 mg total) by mouth at bedtime., Disp: 90 tablet, Rfl: 1  No Known Allergies   Review of Systems  Constitutional: Positive for fever. Negative for chills.  HENT: Positive for congestion and sinus pain. Negative for ear pain and sore throat (initially had sore throat for one day, now resolved).   Respiratory: Positive for cough.   Neurological: Positive for headaches (now resolved).      Objective  Vitals:   11/09/16 1158  BP: 140/80   Pulse: 75  Resp: 18  Temp: 98.1 F (36.7 C)  TempSrc: Oral  SpO2: 95%  Weight: 288 lb (130.6 kg)  Height: 5\' 10"  (1.778 m)    Physical Exam  Constitutional: He is well-developed, well-nourished, and in no distress.  HENT:  Head: Normocephalic and atraumatic.  Right Ear: Tympanic membrane and ear canal normal. No drainage or swelling.  Left Ear: Tympanic membrane and ear canal normal. No drainage or swelling.  Nose: Right sinus exhibits maxillary sinus tenderness. Left sinus exhibits maxillary sinus tenderness.  Mouth/Throat: Posterior oropharyngeal erythema present. No oropharyngeal exudate.  Nasal turbinates hypertrophied, mucosal inflammation.  Cardiovascular: Normal rate, regular rhythm, S1 normal, S2 normal and normal heart sounds.   No murmur heard. Pulmonary/Chest: Effort normal and breath sounds normal. He has no wheezes. He has no rhonchi.  Nursing note and vitals reviewed.    Assessment & Plan  1. Acute non-recurrent maxillary sinusitis By history and exam, started on amoxicillin/clavulanate for 10 days, at Nasonex for relief of nasal inflammation - amoxicillin-clavulanate (AUGMENTIN) 875-125 MG tablet; Take 1 tablet by mouth 2 (two) times daily.  Dispense: 20 tablet; Refill: 0 - mometasone (NASONEX) 50 MCG/ACT nasal spray; Place 2 sprays into the nose daily.  Dispense: 17 g; Refill: 0   Hazel Sams  Theodis ShoveA. Nicolaas Savo [facility] 11/09/2016 12:02 PM

## 2017-01-31 ENCOUNTER — Ambulatory Visit: Payer: BC Managed Care – PPO | Admitting: Family Medicine

## 2017-02-12 ENCOUNTER — Ambulatory Visit: Payer: BC Managed Care – PPO | Admitting: Family Medicine

## 2017-02-19 ENCOUNTER — Ambulatory Visit: Payer: BC Managed Care – PPO | Admitting: Family Medicine

## 2017-03-14 ENCOUNTER — Ambulatory Visit: Payer: BC Managed Care – PPO | Admitting: Family Medicine

## 2017-03-19 ENCOUNTER — Ambulatory Visit: Payer: BC Managed Care – PPO | Admitting: Family Medicine

## 2017-04-18 ENCOUNTER — Other Ambulatory Visit: Payer: Self-pay | Admitting: Family Medicine

## 2017-04-18 DIAGNOSIS — I10 Essential (primary) hypertension: Secondary | ICD-10-CM

## 2017-11-25 ENCOUNTER — Other Ambulatory Visit: Payer: Self-pay

## 2017-11-25 DIAGNOSIS — I10 Essential (primary) hypertension: Secondary | ICD-10-CM

## 2017-11-26 MED ORDER — LOSARTAN POTASSIUM-HCTZ 100-25 MG PO TABS: 1.0000 | ORAL_TABLET | Freq: Every day | ORAL | 0 refills | Status: DC

## 2017-11-26 NOTE — Telephone Encounter (Signed)
Patient has not been seen in over a year I'll refill med this one time and I look forward to meeting him soon Thank you

## 2017-11-26 NOTE — Telephone Encounter (Signed)
Called at 8:56 informing pt that prescription has been sent to pharmacy however he is needing to schedule an appt with Dr Sherie DonLada or Lanora ManisElizabeth.

## 2018-03-22 ENCOUNTER — Other Ambulatory Visit: Payer: Self-pay | Admitting: Family Medicine

## 2018-03-22 DIAGNOSIS — I10 Essential (primary) hypertension: Secondary | ICD-10-CM

## 2018-03-24 ENCOUNTER — Telehealth: Payer: Self-pay | Admitting: Nurse Practitioner

## 2018-03-24 NOTE — Telephone Encounter (Signed)
Please call to schedule appointment ASAP; has not been seen in office for over one year. Will provide 2 weeks of BP medications in the meantime to allow for scheduling.

## 2018-03-24 NOTE — Telephone Encounter (Signed)
Spoke with pt and he scheduled appt with Lanora Manis for 10.15.19

## 2018-04-08 ENCOUNTER — Other Ambulatory Visit: Payer: Self-pay | Admitting: Nurse Practitioner

## 2018-04-08 ENCOUNTER — Encounter: Payer: Self-pay | Admitting: Nurse Practitioner

## 2018-04-08 ENCOUNTER — Ambulatory Visit: Payer: BC Managed Care – PPO | Admitting: Nurse Practitioner

## 2018-04-08 VITALS — BP 138/76 | HR 80 | Temp 97.7°F | Ht 71.0 in | Wt 296.3 lb

## 2018-04-08 DIAGNOSIS — Z1159 Encounter for screening for other viral diseases: Secondary | ICD-10-CM

## 2018-04-08 DIAGNOSIS — E785 Hyperlipidemia, unspecified: Secondary | ICD-10-CM

## 2018-04-08 DIAGNOSIS — I10 Essential (primary) hypertension: Secondary | ICD-10-CM

## 2018-04-08 DIAGNOSIS — Z1211 Encounter for screening for malignant neoplasm of colon: Secondary | ICD-10-CM

## 2018-04-08 DIAGNOSIS — J0100 Acute maxillary sinusitis, unspecified: Secondary | ICD-10-CM

## 2018-04-08 DIAGNOSIS — Z23 Encounter for immunization: Secondary | ICD-10-CM

## 2018-04-08 DIAGNOSIS — Z5181 Encounter for therapeutic drug level monitoring: Secondary | ICD-10-CM

## 2018-04-08 MED ORDER — AMLODIPINE BESYLATE 5 MG PO TABS: 5.0000 mg | ORAL_TABLET | Freq: Every day | ORAL | 1 refills | Status: DC

## 2018-04-08 MED ORDER — MOMETASONE FUROATE 50 MCG/ACT NA SUSP: 2.0000 | Freq: Every day | NASAL | 0 refills | Status: DC

## 2018-04-08 MED ORDER — LOSARTAN POTASSIUM-HCTZ 100-25 MG PO TABS: 1.0000 | ORAL_TABLET | Freq: Every day | ORAL | 1 refills | Status: DC

## 2018-04-08 MED ORDER — OLMESARTAN MEDOXOMIL-HCTZ 40-25 MG PO TABS: 1.0000 | ORAL_TABLET | Freq: Every day | ORAL | 1 refills | Status: DC

## 2018-04-08 NOTE — Progress Notes (Signed)
Name: Tristan Miller   MRN: [id number]    DOB: [birth year]   Date:04/08/2018       Progress Note  Subjective  Chief Complaint  Chief Complaint  Patient presents with  . Medication Refill    nasonex  . Hyperlipidemia    discuss side effects to lovastatin    HPI Hypertension  Patient is taking losartan-HCTZ 100-25mg  daily with occasional missed doses-maybe a few times a month. He did take it today. +8 pounds since last visit, not exercising as much, [facility]; does eat healthy often skips breakfast though.  BP Readings from Last 3 Encounters:  04/08/18 (!) 152/86  11/09/16 140/80  10/31/16 129/80   Wt Readings from Last 3 Encounters:  04/08/18 296 lb 4.8 oz (134.4 kg)  11/09/16 288 lb (130.6 kg)  10/31/16 288 lb 1.6 oz (130.7 kg)   States takes nasonex during seasonal changes, needed refill due to sinus congestion, no fevers or chills.   Hyperlipidemia Has not been taking lovastatin noted that a potential side effect was sucidal thoughts and stopped- never had those but was concerned. Last took over a year ago.  Lab Results  Component Value Date   CHOL 177 10/31/2016   HDL 45 10/31/2016   LDLCALC 116 (H) 10/31/2016   TRIG 79 10/31/2016   CHOLHDL 3.9 10/31/2016     Patient Active Problem List   Diagnosis Date Noted  . Screening for colon cancer 10/31/2016  . Annual physical exam 07/17/2016  . Pre-diabetes 04/17/2016  . Need for influenza vaccination 05/02/2015  . OSA (obstructive sleep apnea) 05/02/2015  . Hyperglycemia 05/02/2015  . Essential hypertension 05/02/2015  . Hyperlipemia 05/02/2015    Past Medical History:  Diagnosis Date  . Hyperlipidemia   . Hypertension     Past Surgical History:  Procedure Laterality Date  . NO PAST SURGERIES      Social History   Tobacco Use  . Smoking status: Never Smoker  . Smokeless tobacco: Never Used  Substance Use Topics  . Alcohol use: No    Alcohol/week: 0.0 standard drinks   Comment: quit 30 years ago     Current Outpatient Medications:  .  aspirin EC 81 MG tablet, Take 81 mg by mouth daily., Disp: , Rfl:  .  losartan-hydrochlorothiazide (HYZAAR) 100-25 MG tablet, TAKE 1 TABLET BY MOUTH EVERY DAY, Disp: 14 tablet, Rfl: 0 .  lovastatin (MEVACOR) 40 MG tablet, Take 1 tablet (40 mg total) by mouth at bedtime. (Patient not taking: Reported on 04/08/2018), Disp: 90 tablet, Rfl: 1 .  mometasone (NASONEX) 50 MCG/ACT nasal spray, Place 2 sprays into the nose daily. (Patient not taking: Reported on 04/08/2018), Disp: 17 g, Rfl: 0  No Known Allergies  ROS   No other specific complaints in a complete review of systems (except as listed in HPI above).  Objective  Vitals:   04/08/18 1015  BP: (!) 152/86  Pulse: 80  Temp: 97.7 F (36.5 C)  TempSrc: Oral  SpO2: 94%  Weight: 296 lb 4.8 oz (134.4 kg)  Height: 5\' 11"  (1.803 m)     Body mass index is 41.33 kg/m.  Nursing Note and Vital Signs reviewed.  Physical Exam  Constitutional: He is oriented to person, place, and time. He appears well-developed and well-nourished.  HENT:  Head: Normocephalic and atraumatic.  Cardiovascular: Normal heart sounds and intact distal pulses.  Pulmonary/Chest: Effort normal and breath sounds normal.  Abdominal: Soft. Bowel sounds are normal.  Musculoskeletal:  Normal range of motion. He exhibits no tenderness or deformity.  Neurological: He is alert and oriented to person, place, and time.  Skin: Skin is warm and dry. No rash noted.  Psychiatric: He has a normal mood and affect. His behavior is normal. Judgment and thought content normal.       No results found for this or any previous visit (from the past 48 hour(s)).  Assessment & Plan  1. Essential hypertension Elevated blood pressure - COMPLETE METABOLIC PANEL WITH GFR - amLODipine (NORVASC) 5 MG tablet; Take 1 tablet (5 mg total) by mouth daily.  Dispense: 90 tablet; Refill: 1 - losartan-hydrochlorothiazide  (HYZAAR) 100-25 MG tablet; Take 1 tablet by mouth daily.  Dispense: 90 tablet; Refill: 1  2. Hyperlipidemia, unspecified hyperlipidemia type Will restart on new cholesterol medication pending lipid panel.  - Lipid Profile  3. Need for influenza vaccination - Flu Vaccine QUAD 6+ mos PF IM (Fluarix Quad PF)  4. Screen for colon cancer - Cologuard  5. Need for Tdap vaccination - Tdap vaccine greater than or equal to 7yo IM  6. Acute non-recurrent maxillary sinusitis - mometasone (NASONEX) 50 MCG/ACT nasal spray; Place 2 sprays into the nose daily.  Dispense: 17 g; Refill: 0  7. Medication monitoring encounter - COMPLETE METABOLIC PANEL WITH GFR  8. Need for hepatitis C screening test - Hepatitis C Antibody  9. Morbid obesity (HCC) Discussed diet and exercise.   One month follow up for blood pressure and weight loss.

## 2018-04-08 NOTE — Patient Instructions (Addendum)
-   Going back to the gym at least 3 days a week - Drink at least 64 ounces of water a day   Your goal blood pressure is less than 140 mmHg on top. Try to follow the DASH guidelines (DASH stands for Dietary Approaches to Stop Hypertension) Try to limit the sodium in your diet.  Ideally, consume less than 1.5 grams (less than 1,500mg ) per day. Do not add salt when cooking or at the table.  Check the sodium amount on labels when shopping, and choose items lower in sodium when given a choice. Avoid or limit foods that already contain a lot of sodium. Eat a diet rich in fruits and vegetables and whole grains.  Good cholesterol, also called high-density lipoprotein (HDL) removes extra cholesterol and plaque buildup in your arteries and then sends it to your liver to get rid of and helps reduce your risk of heart disease, heart attack, and stroke.Foods that increase HDL: beans and legumes, whole grains, high-fiber fruits:prunes, apples, and pears; fatty fish- salmon, tuna, sardines; nuts, olive oil   Bad cholesterol, also called low-density lipoprotein (LDL), carries cholesterol and other fats that your liver makes to your body tissue. If it builds up in blood vessels, LDL can cause heart disease and other health problems. Your LDL level should be below 161. If you have diabetes or a possible heart problem, your LDL should be below 70.  Eat: Eat 20 to 30 grams of soluble fiber every day. Foods such as fruits and vegetables, whole grains, beans, peas, nuts, and seeds can help lower LDL. Avoid: Saturated fats (Dairy foods - such as butter, cream, ghee, regular-fat milk and cheese. Meat - such as fatty cuts of beef, pork and lamb, processed meats like salami, sausages and the skin on chicken. Lard., fatty snack foods, cakes, biscuits, pies and deep fried foods) Avoid smoking

## 2018-04-09 ENCOUNTER — Other Ambulatory Visit: Payer: Self-pay | Admitting: Nurse Practitioner

## 2018-04-09 DIAGNOSIS — E7841 Elevated Lipoprotein(a): Secondary | ICD-10-CM

## 2018-04-09 LAB — COMPLETE METABOLIC PANEL WITH GFR
AG RATIO: 1.3 (calc) (ref 1.0–2.5)
ALT: 22 U/L (ref 9–46)
AST: 20 U/L (ref 10–35)
Albumin: 3.9 g/dL (ref 3.6–5.1)
Alkaline phosphatase (APISO): 64 U/L (ref 40–115)
BUN: 10 mg/dL (ref 7–25)
CO2: 28 mmol/L (ref 20–32)
Calcium: 9.1 mg/dL (ref 8.6–10.3)
Chloride: 103 mmol/L (ref 98–110)
Creat: 1 mg/dL (ref 0.70–1.25)
GFR, Est African American: 94 mL/min/{1.73_m2} (ref >=60)
GFR, Est Non African American: 81 mL/min/{1.73_m2} (ref >=60)
GLOBULIN: 2.9 g/dL (ref 1.9–3.7)
Glucose, Bld: 102 mg/dL — ABNORMAL HIGH (ref 65–99)
POTASSIUM: 3.5 mmol/L (ref 3.5–5.3)
SODIUM: 138 mmol/L (ref 135–146)
Total Bilirubin: 0.4 mg/dL (ref 0.2–1.2)
Total Protein: 6.8 g/dL (ref 6.1–8.1)

## 2018-04-09 LAB — LIPID PANEL
CHOLESTEROL: 222 mg/dL — AB (ref <200)
HDL: 42 mg/dL (ref >40)
LDL Cholesterol (Calc): 155 mg/dL (calc) — ABNORMAL HIGH
NON-HDL CHOLESTEROL (CALC): 180 mg/dL — AB (ref <130)
Total CHOL/HDL Ratio: 5.3 (calc) — ABNORMAL HIGH (ref <5.0)
Triglycerides: 129 mg/dL (ref <150)

## 2018-04-09 LAB — HEPATITIS C ANTIBODY
Hepatitis C Ab: NONREACTIVE
SIGNAL TO CUT-OFF: 0.03 (ref <1.00)

## 2018-04-09 MED ORDER — ATORVASTATIN CALCIUM 40 MG PO TABS: 40.0000 mg | ORAL_TABLET | Freq: Every day | ORAL | 0 refills | Status: DC

## 2018-04-09 NOTE — Progress Notes (Signed)
at

## 2018-05-01 ENCOUNTER — Other Ambulatory Visit: Payer: Self-pay | Admitting: Nurse Practitioner

## 2018-05-01 DIAGNOSIS — J0100 Acute maxillary sinusitis, unspecified: Secondary | ICD-10-CM

## 2018-05-09 ENCOUNTER — Ambulatory Visit: Payer: BC Managed Care – PPO | Admitting: Family Medicine

## 2018-05-14 ENCOUNTER — Encounter: Payer: Self-pay | Admitting: Family Medicine

## 2018-05-14 ENCOUNTER — Ambulatory Visit: Payer: BC Managed Care – PPO | Admitting: Family Medicine

## 2018-05-14 VITALS — BP 130/82 | HR 86 | Temp 97.4°F | Ht 71.0 in | Wt 297.0 lb

## 2018-05-14 DIAGNOSIS — I10 Essential (primary) hypertension: Secondary | ICD-10-CM

## 2018-05-14 DIAGNOSIS — E782 Mixed hyperlipidemia: Secondary | ICD-10-CM

## 2018-05-14 DIAGNOSIS — R7303 Prediabetes: Secondary | ICD-10-CM

## 2018-05-14 NOTE — Assessment & Plan Note (Signed)
Good control; limit sodium; he'll be working on weight loss which should help too; I would love to be able to decrease or even stop one or more medicines in the near future as his weight comes down

## 2018-05-14 NOTE — Progress Notes (Signed)
BP 130/82   Pulse 86   Temp (!) 97.4 F (36.3 C) (Oral)   Ht 5\' 11"  (1.803 m)   Wt 297 lb (134.7 kg)   SpO2 98%   BMI 41.42 kg/m    Subjective:    Patient ID: Tristan Miller, male    DOB: [birth year], 62 y.o.   MRN: [id number]  HPI: Tristan Miller is a 62 y.o. male  Chief Complaint  Patient presents with  . Follow-up  . Hypertension    HPI Patient is here for follow-up; he is a new patient to me today, though not new to the practice  High cholesterol; we reviewed previous four lipid panels, when he was high, then it improved with healthier eating and weight management; his total dropped total from 223 to 174 with healthy lifestyle changes; this it went back up; schedule changed and gained weight; getting 3 days walking a week; started off with a mile then to 1.5 miles, then up to 2 miles  He used to take statin; he had never seen the side effects and then saw suicidal thoughts in the literature so switched from lovastatin to atorvastatin; on that for about 3 weeks now, doing well  Talked about diet; this last year, has had bacon; before this year, would not eat bacon, just started to eat a little bacon; his kids tell him to stay out of [employer]; not much pork, just a couple times a month; burgers once a week; 3 steaks this whole year; eating dinner a little earlier now, trying to not eat heavy meal at the end of the day  Parents are both deceased; father was probably not on a chol medicines, lived to 49 and 10.5 months old  The 10-year ASCVD risk score Denman George DC Montez Hageman., et al., 2013) is: 14.9%   Values used to calculate the score:     Age: 70 years     Sex: Male     Is Non-Hispanic African American: Yes     Diabetic: No     Tobacco smoker: No     Systolic Blood Pressure: 130 mmHg     Is BP treated: Yes     HDL Cholesterol: 42 mg/dL     Total Cholesterol: 222 mg/dL   Hypertension; change in the medicine; improvement almost right away; 117 to 120 systolic down from  140 and 150  Morbid obesity; gaining weight; schedule is part of the problem  Prediabetes; cutting back on the bread; dropped Pepsi; now he drinks sweet tea  Depression screen [facility] 2/9 05/14/2018 04/08/2018 10/31/2016 07/17/2016 04/17/2016  Decreased Interest 0 0 0 0 0  Down, Depressed, Hopeless 0 0 0 0 0  PHQ - 2 Score 0 0 0 0 0  Altered sleeping 0 0 - - -  Tired, decreased energy 0 0 - - -  Change in appetite 0 0 - - -  Feeling bad or failure about yourself  0 0 - - -  Trouble concentrating 0 0 - - -  Moving slowly or fidgety/restless 0 0 - - -  Suicidal thoughts 0 0 - - -  PHQ-9 Score 0 0 - - -  Difficult doing work/chores Not difficult at all Not difficult at all - - -   Fall Risk  05/14/2018 04/08/2018 10/31/2016 07/17/2016 04/17/2016  Falls in the past year? 0 No No No No  Number falls in past yr: 0 - - - -    Relevant past medical, surgical, family and  social history reviewed Past Medical History:  Diagnosis Date  . Hyperlipidemia   . Hypertension    Past Surgical History:  Procedure Laterality Date  . NO PAST SURGERIES     History reviewed. No pertinent family history. Social History   Tobacco Use  . Smoking status: Never Smoker  . Smokeless tobacco: Never Used  Substance Use Topics  . Alcohol use: No    Alcohol/week: 0.0 standard drinks    Comment: quit 30 years ago  . Drug use: No     [facility] Visit from [facility]/14/2018 in [facility]  AUDIT-C Score  0      Interim medical history since last visit reviewed. Allergies and medications reviewed  Review of Systems Per HPI unless specifically indicated above     Objective:    BP 130/82   Pulse 86   Temp (!) 97.4 F (36.3 C) (Oral)   Ht 5\' 11"  (1.803 m)   Wt 297 lb (134.7 kg)   SpO2 98%   BMI 41.42 kg/m   Wt Readings from Last 3 Encounters:  05/14/18 297 lb (134.7 kg)  04/08/18 296 lb 4.8 oz (134.4 kg)  11/09/16 288 lb (130.6 kg)    Physical Exam  Constitutional: He appears  well-developed and well-nourished. No distress.  HENT:  Head: Normocephalic and atraumatic.  Eyes: EOM are normal. No scleral icterus.  Neck: No JVD present.  Cardiovascular: Normal rate and regular rhythm.  Pulmonary/Chest: Effort normal and breath sounds normal.  Abdominal: Soft. Bowel sounds are normal. He exhibits no distension.  Musculoskeletal: He exhibits no edema.  Neurological: Coordination normal.  Skin: Skin is warm and dry. No pallor.  Psychiatric: He has a normal mood and affect.  Pleasant, good historian       Assessment & Plan:   Problem List Items Addressed This Visit      Cardiovascular and Mediastinum   Essential hypertension - Primary (Chronic)    Good control; limit sodium; he'll be working on weight loss which should help too; I would love to be able to decrease or even stop one or more medicines in the near future as his weight comes down        Other   Pre-diabetes (Chronic)    Monitor glucose and A1c every 6-12 months; weight loss is the single most important thing he can be doing to help prevent or at least slow this from progression to type 2 diabetes mellitus      Relevant Orders   Hemoglobin A1c   Morbid obesity (HCC)    Encouragement given; we reviewed his lipid panel and how that had changed with his healthier lifestyle pattern; he sounds very motivated and he'll start working on this one step at a time; see AVS      Hyperlipemia (Chronic)    Will check lipid panel after he has been on the new statin for 6-8 weeks; encouragement given for healthy eating, weight management, etc; he has certainly shown improvements in the past with TLC      Relevant Orders   Lipid panel       Follow up plan: Return in about 3 months (around 08/14/2018).  An after-visit summary was printed and given to the patient at check-out.  Please see the patient instructions which may contain other information and recommendations beyond what is mentioned above in the  assessment and plan.  No orders of the defined types were placed in this encounter.   Orders Placed This Encounter  Procedures  . Lipid panel  . Hemoglobin A1c

## 2018-05-14 NOTE — Assessment & Plan Note (Signed)
Monitor glucose and A1c every 6-12 months; weight loss is the single most important thing he can be doing to help prevent or at least slow this from progression to type 2 diabetes mellitus

## 2018-05-14 NOTE — Assessment & Plan Note (Signed)
Will check lipid panel after he has been on the new statin for 6-8 weeks; encouragement given for healthy eating, weight management, etc; he has certainly shown improvements in the past with TLC

## 2018-05-14 NOTE — Assessment & Plan Note (Signed)
Encouragement given; we reviewed his lipid panel and how that had changed with his healthier lifestyle pattern; he sounds very motivated and he'll start working on this one step at a time; see AVS

## 2018-05-14 NOTE — Patient Instructions (Addendum)
Try to follow the DASH guidelines (DASH stands for Dietary Approaches to Stop Hypertension). Try to limit the sodium in your diet to no more than 1,500mg  of sodium per day. Certainly try to not exceed 2,000 mg per day at the very most. Do not add salt when cooking or at the table.  Check the sodium amount on labels when shopping, and choose items lower in sodium when given a choice. Avoid or limit foods that already contain a lot of sodium. Eat a diet rich in fruits and vegetables and whole grains, and try to lose weight if overweight or obese  Check out the information at familydoctor.org entitled "Nutrition for Weight Loss: What You Need to Know about Fad Diets" Try to lose between 1-2 pounds per week by taking in fewer calories and burning off more calories You can succeed by limiting portions, limiting foods dense in calories and fat, becoming more active, and drinking 8 glasses of water a day (64 ounces) Don't skip meals, especially breakfast, as skipping meals may alter your metabolism Do not use over-the-counter weight loss pills or gimmicks that claim rapid weight loss A healthy BMI (or body mass index) is between 18.5 and 24.9 You can calculate your ideal BMI at the NIH website JobEconomics.huhttp://www.nhlbi.nih.gov/health/educational/lose_wt/BMI/bmicalc.htm  Try to limit saturated fats in your diet (bologna, hot dogs, barbeque, cheeseburgers, hamburgers, steak, bacon, sausage, cheese, etc.) and get more fresh fruits, vegetables, and whole grains   Obesity, Adult Obesity is the condition of having too much total body fat. Being overweight or obese means that your weight is greater than what is considered healthy for your body size. Obesity is determined by a measurement called BMI. BMI is an estimate of body fat and is calculated from height and weight. For adults, a BMI of 30 or higher is considered obese. Obesity can eventually lead to other health concerns and major illnesses,  including:  Stroke.  Coronary artery disease (CAD).  Type 2 diabetes.  Some types of cancer, including cancers of the colon, breast, uterus, and gallbladder.  Osteoarthritis.  High blood pressure (hypertension).  High cholesterol.  Sleep apnea.  Gallbladder stones.  Infertility problems.  What are the causes? The main cause of obesity is taking in (consuming) more calories than your body uses for energy. Other factors that contribute to this condition may include:  Being born with genes that make you more likely to become obese.  Having a medical condition that causes obesity. These conditions include: ? Hypothyroidism. ? Polycystic ovarian syndrome (PCOS). ? Binge-eating disorder. ? Cushing syndrome.  Taking certain medicines, such as steroids, antidepressants, and seizure medicines.  Not being physically active (sedentary lifestyle).  Living where there are limited places to exercise safely or buy healthy foods.  Not getting enough sleep.  What increases the risk? The following factors may increase your risk of this condition:  Having a family history of obesity.  Being a woman of African-American descent.  Being a man of Hispanic descent.  What are the signs or symptoms? Having excessive body fat is the main symptom of this condition. How is this diagnosed? This condition may be diagnosed based on:  Your symptoms.  Your medical history.  A physical exam. Your health care provider may measure: ? Your BMI. If you are an adult with a BMI between 25 and less than 30, you are considered overweight. If you are an adult with a BMI of 30 or higher, you are considered obese. ? The distances around your hips  and your waist (circumferences). These may be compared to each other to help diagnose your condition. ? Your skinfold thickness. Your health care provider may gently pinch a fold of your skin and measure it.  How is this treated? Treatment for this  condition often includes changing your lifestyle. Treatment may include some or all of the following:  Dietary changes. Work with your health care provider and a dietitian to set a weight-loss goal that is healthy and reasonable for you. Dietary changes may include eating: ? Smaller portions. A portion size is the amount of a particular food that is healthy for you to eat at one time. This varies from person to person. ? Low-calorie or low-fat options. ? More whole grains, fruits, and vegetables.  Regular physical activity. This may include aerobic activity (cardio) and strength training.  Medicine to help you lose weight. Your health care provider may prescribe medicine if you are unable to lose 1 pound a week after 6 weeks of eating more healthily and doing more physical activity.  Surgery. Surgical options may include gastric banding and gastric bypass. Surgery may be done if: ? Other treatments have not helped to improve your condition. ? You have a BMI of 40 or higher. ? You have life-threatening health problems related to obesity.  Follow these instructions at home:  Eating and drinking   Follow recommendations from your health care provider about what you eat and drink. Your health care provider may advise you to: ? Limit fast foods, sweets, and processed snack foods. ? Choose low-fat options, such as low-fat milk instead of whole milk. ? Eat 5 or more servings of fruits or vegetables every day. ? Eat at home more often. This gives you more control over what you eat. ? Choose healthy foods when you eat out. ? Learn what a healthy portion size is. ? Keep low-fat snacks on hand. ? Avoid sugary drinks, such as soda, fruit juice, iced tea sweetened with sugar, and flavored milk. ? Eat a healthy breakfast.  Drink enough water to keep your urine clear or pale yellow.  Do not go without eating for long periods of time (do not fast) or follow a fad diet. Fasting and fad diets can be  unhealthy and even dangerous. Physical Activity  Exercise regularly, as told by your health care provider. Ask your health care provider what types of exercise are safe for you and how often you should exercise.  Warm up and stretch before being active.  Cool down and stretch after being active.  Rest between periods of activity. Lifestyle  Limit the time that you spend in front of your TV, computer, or video game system.  Find ways to reward yourself that do not involve food.  Limit alcohol intake to no more than 1 drink a day for nonpregnant women and 2 drinks a day for men. One drink equals 12 oz of beer, 5 oz of wine, or 1 oz of hard liquor. General instructions  Keep a weight loss journal to keep track of the food you eat and how much you exercise you get.  Take over-the-counter and prescription medicines only as told by your health care provider.  Take vitamins and supplements only as told by your health care provider.  Consider joining a support group. Your health care provider may be able to recommend a support group.  Keep all follow-up visits as told by your health care provider. This is important. Contact a health care provider if:  You are unable to meet your weight loss goal after 6 weeks of dietary and lifestyle changes. This information is not intended to replace advice given to you by your health care provider. Make sure you discuss any questions you have with your health care provider. Document Released: 07/19/2004 Document Revised: 11/14/2015 Document Reviewed: 03/30/2015 Elsevier Interactive Patient Education  2018 [employer]  Preventing Unhealthy [employer], Adult Staying at a healthy weight is important. When fat builds up in your body, you may become overweight or obese. These conditions put you at greater risk for developing certain health problems, such as heart disease, diabetes, sleeping problems, joint problems, and some cancers. Unhealthy weight  gain is often the result of making unhealthy choices in what you eat. It is also a result of not getting enough exercise. You can make changes to your lifestyle to prevent obesity and stay as healthy as possible. What nutrition changes can be made? To maintain a healthy weight and prevent obesity:  Eat only as much as your body needs. To do this: ? Pay attention to signs that you are hungry or full. Stop eating as soon as you feel full. ? If you feel hungry, try drinking water first. Drink enough water so your urine is clear or pale yellow. ? Eat smaller portions. ? Look at serving sizes on food labels. Most foods contain more than one serving per container. ? Eat the recommended amount of calories for your gender and activity level. While most active people should eat around 2,000 calories per day, if you are trying to lose weight or are not very active, you main need to eat less calories. Talk to your health care provider or dietitian about how many calories you should eat each day.  Choose healthy foods, such as: ? Fruits and vegetables. Try to fill at least half of your plate at each meal with fruits and vegetables. ? Whole grains, such as whole wheat bread, brown rice, and quinoa. ? Lean meats, such as chicken or fish. ? Other healthy proteins, such as beans, eggs, or tofu. ? Healthy fats, such as nuts, seeds, fatty fish, and olive oil. ? Low-fat or fat-free dairy.  Check food labels and avoid food and drinks that: ? Are high in calories. ? Have added sugar. ? Are high in sodium. ? Have saturated fats or trans fats.  Limit how much you eat of the following foods: ? Prepackaged meals. ? Fast food. ? Fried foods. ? Processed meat, such as bacon, sausage, and deli meats. ? Fatty cuts of red meat and poultry with skin.  Cook foods in healthier ways, such as by baking, broiling, or grilling.  When grocery shopping, try to shop around the outside of the store. This helps you buy  mostly fresh foods and avoid canned and prepackaged foods.  What lifestyle changes can be made?  Exercise at least 30 minutes 5 or more days each week. Exercising includes brisk walking, yard work, biking, running, swimming, and team sports like basketball and soccer. Ask your health care provider which exercises are safe for you.  Do not use any products that contain nicotine or tobacco, such as cigarettes and e-cigarettes. If you need help quitting, ask your health care provider.  Limit alcohol intake to no more than 1 drink a day for nonpregnant women and 2 drinks a day for men. One drink equals 12 oz of beer, 5 oz of wine, or 1 oz of hard liquor.  Try to get 7-9  hours of sleep each night. What other changes can be made?  Keep a food and activity journal to keep track of: ? What you ate and how many calories you had. Remember to count sauces, dressings, and side dishes. ? Whether you were active, and what exercises you did. ? Your calorie, weight, and activity goals.  Check your weight regularly. Track any changes. If you notice you have gained weight, make changes to your diet or activity routine.  Avoid taking weight-loss medicines or supplements. Talk to your health care provider before starting any new medicine or supplement.  Talk to your health care provider before trying any new diet or exercise plan. Why are these changes important? Eating healthy, staying active, and having healthy habits not only help prevent obesity, they also:  Help you to manage stress and emotions.  Help you to connect with friends and family.  Improve your self-esteem.  Improve your sleep.  Prevent long-term health problems.  What can happen if changes are not made? Being obese or overweight can cause you to develop joint or bone problems, which can make it hard for you to stay active or do activities you enjoy. Being obese or overweight also puts stress on your heart and lungs and can lead to  health problems like diabetes, heart disease, and some cancers. Where to find more information: Talk with your health care provider or a dietitian about healthy eating and healthy lifestyle choices. You may also find other information through these resources:  U.S. Department of Agriculture MyPlate: https://ball-collins.biz/  American Heart Association: www.heart.org  Centers for Disease Control and Prevention: FootballExhibition.com.br  Summary  Staying at a healthy weight is important. It helps prevent certain diseases and health problems, such as heart disease, diabetes, joint problems, sleep disorders, and some cancers.  Being obese or overweight can cause you to develop joint or bone problems, which can make it hard for you to stay active or do activities you enjoy.  You can prevent unhealthy weight gain by eating a healthy diet, exercising regularly, not smoking, limiting alcohol, and getting enough sleep.  Talk with your health care provider or a dietitian for guidance about healthy eating and healthy lifestyle choices. This information is not intended to replace advice given to you by your health care provider. Make sure you discuss any questions you have with your health care provider. Document Released: 06/12/2016 Document Revised: 07/18/2016 Document Reviewed: 07/18/2016 Elsevier Interactive Patient Education  [employer].

## 2018-07-06 ENCOUNTER — Other Ambulatory Visit: Payer: Self-pay | Admitting: Nurse Practitioner

## 2018-07-06 DIAGNOSIS — E7841 Elevated Lipoprotein(a): Secondary | ICD-10-CM

## 2018-07-07 NOTE — Telephone Encounter (Signed)
Please remind patient that we had hoped to get labs on him 6-8 weeks after the atorvastatin start There are orders pending (lipids and A1c) I'll fill limited Rx but don't want to send a 90 day supply of the wrong dose in case we change it

## 2018-07-07 NOTE — Telephone Encounter (Signed)
Left delaited voicemail

## 2018-08-12 ENCOUNTER — Encounter: Payer: Self-pay | Admitting: Family Medicine

## 2018-08-12 ENCOUNTER — Ambulatory Visit: Payer: BC Managed Care – PPO | Admitting: Family Medicine

## 2018-08-12 VITALS — BP 122/80 | HR 69 | Temp 97.7°F | Ht 71.0 in | Wt 297.0 lb

## 2018-08-12 DIAGNOSIS — E782 Mixed hyperlipidemia: Secondary | ICD-10-CM

## 2018-08-12 DIAGNOSIS — J069 Acute upper respiratory infection, unspecified: Secondary | ICD-10-CM | POA: Diagnosis not present

## 2018-08-12 DIAGNOSIS — R7303 Prediabetes: Secondary | ICD-10-CM | POA: Diagnosis not present

## 2018-08-12 DIAGNOSIS — I10 Essential (primary) hypertension: Secondary | ICD-10-CM | POA: Diagnosis not present

## 2018-08-12 NOTE — Assessment & Plan Note (Signed)
Encouragement given to reach his ideal body weight, identify hurdles standing in his way and start to address them; lose weight one pound at a time; see AVS

## 2018-08-12 NOTE — Assessment & Plan Note (Signed)
Check A1c; weight loss is so important and he'll be working on weight loss and healthier diet

## 2018-08-12 NOTE — Patient Instructions (Addendum)
Plain Mucinex or Mucinex DM (cough suppressant) will be helpful Generic is fine Stay hydrated  Consider CoQ-10 which may help the aches associated with cholesterol medicine; if helpful, you can continue  Check out the information at familydoctor.org entitled "Nutrition for Weight Loss: What You Need to Know about Fad Diets" Try to lose between 1-2 pounds per week by taking in fewer calories and burning off more calories You can succeed by limiting portions, limiting foods dense in calories and fat, becoming more active, and drinking 8 glasses of water a day (64 ounces) Don't skip meals, especially breakfast, as skipping meals may alter your metabolism Do not use over-the-counter weight loss pills or gimmicks that claim rapid weight loss A healthy BMI (or body mass index) is between 18.5 and 24.9 You can calculate your ideal BMI at the NIH website JobEconomics.hu  Get over those hurdles     Obesity, Adult Obesity is the condition of having too much total body fat. Being overweight or obese means that your weight is greater than what is considered healthy for your body size. Obesity is determined by a measurement called BMI. BMI is an estimate of body fat and is calculated from height and weight. For adults, a BMI of 30 or higher is considered obese. Obesity can eventually lead to other health concerns and major illnesses, including:  Stroke.  Coronary artery disease (CAD).  Type 2 diabetes.  Some types of cancer, including cancers of the colon, breast, uterus, and gallbladder.  Osteoarthritis.  High blood pressure (hypertension).  High cholesterol.  Sleep apnea.  Gallbladder stones.  Infertility problems. What are the causes? The main cause of obesity is taking in (consuming) more calories than your body uses for energy. Other factors that contribute to this condition may include:  Being born with genes that make you more  likely to become obese.  Having a medical condition that causes obesity. These conditions include: ? Hypothyroidism. ? Polycystic ovarian syndrome (PCOS). ? Binge-eating disorder. ? Cushing syndrome.  Taking certain medicines, such as steroids, antidepressants, and seizure medicines.  Not being physically active (sedentary lifestyle).  Living where there are limited places to exercise safely or buy healthy foods.  Not getting enough sleep. What increases the risk? The following factors may increase your risk of this condition:  Having a family history of obesity.  Being a woman of African-American descent.  Being a man of Hispanic descent. What are the signs or symptoms? Having excessive body fat is the main symptom of this condition. How is this diagnosed? This condition may be diagnosed based on:  Your symptoms.  Your medical history.  A physical exam. Your health care provider may measure: ? Your BMI. If you are an adult with a BMI between 25 and less than 30, you are considered overweight. If you are an adult with a BMI of 30 or higher, you are considered obese. ? The distances around your hips and your waist (circumferences). These may be compared to each other to help diagnose your condition. ? Your skinfold thickness. Your health care provider may gently pinch a fold of your skin and measure it. How is this treated? Treatment for this condition often includes changing your lifestyle. Treatment may include some or all of the following:  Dietary changes. Work with your health care provider and a dietitian to set a weight-loss goal that is healthy and reasonable for you. Dietary changes may include eating: ? Smaller portions. A portion size is the amount of a  particular food that is healthy for you to eat at one time. This varies from person to person. ? Low-calorie or low-fat options. ? More whole grains, fruits, and vegetables.  Regular physical activity. This may  include aerobic activity (cardio) and strength training.  Medicine to help you lose weight. Your health care provider may prescribe medicine if you are unable to lose 1 pound a week after 6 weeks of eating more healthily and doing more physical activity.  Surgery. Surgical options may include gastric banding and gastric bypass. Surgery may be done if: ? Other treatments have not helped to improve your condition. ? You have a BMI of 40 or higher. ? You have life-threatening health problems related to obesity. Follow these instructions at home:  Eating and drinking   Follow recommendations from your health care provider about what you eat and drink. Your health care provider may advise you to: ? Limit fast foods, sweets, and processed snack foods. ? Choose low-fat options, such as low-fat milk instead of whole milk. ? Eat 5 or more servings of fruits or vegetables every day. ? Eat at home more often. This gives you more control over what you eat. ? Choose healthy foods when you eat out. ? Learn what a healthy portion size is. ? Keep low-fat snacks on hand. ? Avoid sugary drinks, such as soda, fruit juice, iced tea sweetened with sugar, and flavored milk. ? Eat a healthy breakfast.  Drink enough water to keep your urine clear or pale yellow.  Do not go without eating for long periods of time (do not fast) or follow a fad diet. Fasting and fad diets can be unhealthy and even dangerous. Physical Activity  Exercise regularly, as told by your health care provider. Ask your health care provider what types of exercise are safe for you and how often you should exercise.  Warm up and stretch before being active.  Cool down and stretch after being active.  Rest between periods of activity. Lifestyle  Limit the time that you spend in front of your TV, computer, or video game system.  Find ways to reward yourself that do not involve food.  Limit alcohol intake to no more than 1 drink a  day for nonpregnant women and 2 drinks a day for men. One drink equals 12 oz of beer, 5 oz of wine, or 1 oz of hard liquor. General instructions  Keep a weight loss journal to keep track of the food you eat and how much you exercise you get.  Take over-the-counter and prescription medicines only as told by your health care provider.  Take vitamins and supplements only as told by your health care provider.  Consider joining a support group. Your health care provider may be able to recommend a support group.  Keep all follow-up visits as told by your health care provider. This is important. Contact a health care provider if:  You are unable to meet your weight loss goal after 6 weeks of dietary and lifestyle changes. This information is not intended to replace advice given to you by your health care provider. Make sure you discuss any questions you have with your health care provider. Document Released: 07/19/2004 Document Revised: 11/14/2015 Document Reviewed: 03/30/2015 Elsevier Interactive Patient Education  2019 [employer].

## 2018-08-12 NOTE — Assessment & Plan Note (Signed)
Controlled; watch salt, try to lose weight

## 2018-08-12 NOTE — Assessment & Plan Note (Signed)
Healthy eating, weight loss, check lipids

## 2018-08-12 NOTE — Progress Notes (Signed)
BP 122/80   Pulse 69   Temp 97.7 F (36.5 C)   Ht 5\' 11"  (1.803 m)   Wt 297 lb (134.7 kg)   SpO2 98%   BMI 41.42 kg/m    Subjective:    Patient ID: Tristan Miller, male    DOB: 07-11-1955, 63 y.o.   MRN: [id number]  HPI: BRIDGE MARZ is a 63 y.o. male  Chief Complaint  Patient presents with  . Follow-up    HPI Patient is here for f/u  He was sick last week; temp on Sunday was 98 degrees; got sick on Wednesday, in the bed on Thursday, getting better over the weekend and now much better even better than yesterday; coughing some stuff up; hard to say how often he's coughing; mornings he gets some darker stuff, clears up through the day; getting some chunks up; no travel, no rash; no visits to hospital or nursing home  Obesity; [facility] [employer] and [facility]; eats in the evenings; he has got to get rid of that; if he could get done eating by 6 or 7 pm, that would help; breakfast around 12 noon; started eating Cheerios the ast few weeks; not hydrating as much during the day; stopped Pepsi and started drinking tea, then stopped, back on sweet tea  Prediabetes for years; some dry mouth; snores, tested for sleep apnea and was told no; no new nocturia unless related to diuretic; no blurred vision postprandial  High cholesterol; father did well until he was about 78 years old; mother had a stroke in late 48s, not taking medicine because she couldn't afford it; high LDL even for him; started the statin; a little aches at first, but he blocked it out; tough to give up cheese;    Depression screen [facility] 2/9 08/12/2018 05/14/2018 04/08/2018 10/31/2016 07/17/2016  Decreased Interest 0 0 0 0 0  Down, Depressed, Hopeless 0 0 0 0 0  PHQ - 2 Score 0 0 0 0 0  Altered sleeping 0 0 0 - -  Tired, decreased energy 0 0 0 - -  Change in appetite 0 0 0 - -  Feeling bad or failure about yourself  0 0 0 - -  Trouble concentrating 0 0 0 - -  Moving slowly or fidgety/restless 0 0 0 - -  Suicidal  thoughts 0 0 0 - -  PHQ-9 Score 0 0 0 - -  Difficult doing work/chores Not difficult at all Not difficult at all Not difficult at all - -   Fall Risk  08/12/2018 05/14/2018 04/08/2018 10/31/2016 07/17/2016  Falls in the past year? 0 0 No No No  Number falls in past yr: - 0 - - -    Relevant past medical, surgical, family and social history reviewed Past Medical History:  Diagnosis Date  . Hyperlipidemia   . Hypertension    Past Surgical History:  Procedure Laterality Date  . NO PAST SURGERIES     History reviewed. No pertinent family history. Social History   Tobacco Use  . Smoking status: Never Smoker  . Smokeless tobacco: Never Used  Substance Use Topics  . Alcohol use: No    Alcohol/week: 0.0 standard drinks    Comment: quit 30 years ago  . Drug use: No     Office Visit from 08/12/2018 in [facility]  AUDIT-C Score  0      Interim medical history since last visit reviewed. Allergies and medications reviewed  Review of  Systems Per HPI unless specifically indicated above     Objective:    BP 122/80   Pulse 69   Temp 97.7 F (36.5 C)   Ht 5\' 11"  (1.803 m)   Wt 297 lb (134.7 kg)   SpO2 98%   BMI 41.42 kg/m   Wt Readings from Last 3 Encounters:  08/12/18 297 lb (134.7 kg)  05/14/18 297 lb (134.7 kg)  04/08/18 296 lb 4.8 oz (134.4 kg)    Physical Exam Constitutional:      General: He is not in acute distress.    Appearance: He is well-developed. He is obese.  HENT:     Head: Normocephalic and atraumatic.  Eyes:     General: No scleral icterus. Neck:     Thyroid: No thyromegaly.  Cardiovascular:     Rate and Rhythm: Normal rate and regular rhythm.  Pulmonary:     Effort: Pulmonary effort is normal.     Breath sounds: Normal breath sounds.  Abdominal:     General: Bowel sounds are normal. There is no distension.     Palpations: Abdomen is soft.  Skin:    General: Skin is warm and dry.     Coloration: Skin is not pale.    Neurological:     Mental Status: He is alert.     Coordination: Coordination normal.  Psychiatric:        Behavior: Behavior normal.        Thought Content: Thought content normal.        Judgment: Judgment normal.     Results for orders placed or performed in visit on 04/08/18  COMPLETE METABOLIC PANEL WITH GFR  Result Value Ref Range   Glucose, Bld 102 (H) 65 - 99 mg/dL   BUN 10 7 - 25 mg/dL   Creat 1.611.00 0.960.70 - 0.451.25 mg/dL   GFR, Est Non African American 81 > OR = 60 mL/min/1.3073m2   GFR, Est African American 94 > OR = 60 mL/min/1.5773m2   BUN/Creatinine Ratio NOT APPLICABLE 6 - 22 (calc)   Sodium 138 135 - 146 mmol/L   Potassium 3.5 3.5 - 5.3 mmol/L   Chloride 103 98 - 110 mmol/L   CO2 28 20 - 32 mmol/L   Calcium 9.1 8.6 - 10.3 mg/dL   Total Protein 6.8 6.1 - 8.1 g/dL   Albumin 3.9 3.6 - 5.1 g/dL   Globulin 2.9 1.9 - 3.7 g/dL (calc)   AG Ratio 1.3 1.0 - 2.5 (calc)   Total Bilirubin 0.4 0.2 - 1.2 mg/dL   Alkaline phosphatase (APISO) 64 40 - 115 U/L   AST 20 10 - 35 U/L   ALT 22 9 - 46 U/L  Lipid Profile  Result Value Ref Range   Cholesterol 222 (H) <200 mg/dL   HDL 42 >40>40 mg/dL   Triglycerides 981129 <191<150 mg/dL   LDL Cholesterol (Calc) 155 (H) mg/dL (calc)   Total CHOL/HDL Ratio 5.3 (H) <5.0 (calc)   Non-HDL Cholesterol (Calc) 180 (H) <130 mg/dL (calc)  Hepatitis C antibody  Result Value Ref Range   Hepatitis C Ab NON-REACTIVE NON-REACTI   SIGNAL TO CUT-OFF 0.03 <1.00      Assessment & Plan:   Problem List Items Addressed This Visit      Cardiovascular and Mediastinum   Essential hypertension (Chronic)    Controlled; watch salt, try to lose weight        Other   Pre-diabetes (Chronic)    Check A1c; weight loss is so  important and he'll be working on weight loss and healthier diet      Hyperlipidemia - Primary    Healthy eating, weight loss, check lipids       Other Visit Diagnoses    Acute upper respiratory infection       sounds to be resolving;  however, watch for s/s of secondary bacterial infection; he may call for ABX if new fever, resurgence of cough, etc       Follow up plan: Return in about 3 months (around 11/10/2018) for follow-up visit with Dr. Sherie Don.  An after-visit summary was printed and given to the patient at check-out.  Please see the patient instructions which may contain other information and recommendations beyond what is mentioned above in the assessment and plan.  No orders of the defined types were placed in this encounter.   No orders of the defined types were placed in this encounter.

## 2018-08-13 ENCOUNTER — Other Ambulatory Visit: Payer: Self-pay | Admitting: Family Medicine

## 2018-08-13 DIAGNOSIS — E7841 Elevated Lipoprotein(a): Secondary | ICD-10-CM

## 2018-08-13 LAB — HEMOGLOBIN A1C
Hgb A1c MFr Bld: 6.3 % of total Hgb — ABNORMAL HIGH (ref <5.7)
MEAN PLASMA GLUCOSE: 134 (calc)
eAG (mmol/L): 7.4 (calc)

## 2018-08-13 LAB — LIPID PANEL
CHOL/HDL RATIO: 3.8 (calc) (ref <5.0)
CHOLESTEROL: 141 mg/dL (ref <200)
HDL: 37 mg/dL — AB (ref >=40)
LDL CHOLESTEROL (CALC): 83 mg/dL
Non-HDL Cholesterol (Calc): 104 mg/dL (calc) (ref <130)
Triglycerides: 114 mg/dL (ref <150)

## 2018-08-13 MED ORDER — ATORVASTATIN CALCIUM 40 MG PO TABS: 40.0000 mg | ORAL_TABLET | Freq: Every day | ORAL | 1 refills | Status: DC

## 2018-08-13 NOTE — Progress Notes (Signed)
Tristan Miller, please let the patient know that he has really made great strides with his cholesterol. He brought his total cholesterol down from 222 to 141 (that's 81 points), and he brought his LDL down from 155 to 83 (that's 72 points). Fantastic! His HDL is a little low, but he can bring that up with walking and weight loss. I sent more atorvastatin refills. His 3 month blood sugar went up some, but it's still prediabetic. Offer teaching, referral to diabetic education classes if he'd like (REFER for prediabetes if desired). We'll recheck this is 6 months. Thank you

## 2018-09-02 ENCOUNTER — Other Ambulatory Visit: Payer: Self-pay | Admitting: Nurse Practitioner

## 2018-09-02 DIAGNOSIS — I10 Essential (primary) hypertension: Secondary | ICD-10-CM

## 2018-10-12 ENCOUNTER — Other Ambulatory Visit: Payer: Self-pay | Admitting: Nurse Practitioner

## 2018-10-12 DIAGNOSIS — I10 Essential (primary) hypertension: Secondary | ICD-10-CM

## 2018-10-13 NOTE — Telephone Encounter (Signed)
Lab Results  Component Value Date   CREATININE 1.00 04/08/2018   Lab Results  Component Value Date   K 3.5 04/08/2018

## 2018-11-11 ENCOUNTER — Ambulatory Visit: Payer: BC Managed Care – PPO | Admitting: Family Medicine

## 2019-01-20 ENCOUNTER — Telehealth: Payer: Self-pay | Admitting: Family Medicine

## 2019-01-20 DIAGNOSIS — I10 Essential (primary) hypertension: Secondary | ICD-10-CM

## 2019-01-21 NOTE — Telephone Encounter (Signed)
Please schedule patient for follow up in the next 30 days.  

## 2019-01-21 NOTE — Telephone Encounter (Signed)
Pt already had appt scheduled for 8.18.2020 and I had to switch it to [place]. lvm informing that script has been sent to pharmacy

## 2019-02-10 ENCOUNTER — Ambulatory Visit: Payer: Self-pay | Admitting: Nurse Practitioner

## 2019-02-21 ENCOUNTER — Other Ambulatory Visit: Payer: Self-pay | Admitting: Family Medicine

## 2019-02-21 DIAGNOSIS — I10 Essential (primary) hypertension: Secondary | ICD-10-CM

## 2019-03-17 ENCOUNTER — Ambulatory Visit (INDEPENDENT_AMBULATORY_CARE_PROVIDER_SITE_OTHER): Payer: BC Managed Care – PPO | Admitting: Family Medicine

## 2019-03-17 ENCOUNTER — Encounter: Payer: Self-pay | Admitting: Family Medicine

## 2019-03-17 DIAGNOSIS — E785 Hyperlipidemia, unspecified: Secondary | ICD-10-CM | POA: Diagnosis not present

## 2019-03-17 DIAGNOSIS — J309 Allergic rhinitis, unspecified: Secondary | ICD-10-CM

## 2019-03-17 DIAGNOSIS — I10 Essential (primary) hypertension: Secondary | ICD-10-CM | POA: Diagnosis not present

## 2019-03-17 DIAGNOSIS — R7303 Prediabetes: Secondary | ICD-10-CM

## 2019-03-17 MED ORDER — ATORVASTATIN CALCIUM 40 MG PO TABS: 40.0000 mg | ORAL_TABLET | Freq: Every day | ORAL | 1 refills | Status: DC

## 2019-03-17 MED ORDER — OLMESARTAN MEDOXOMIL-HCTZ 40-25 MG PO TABS: 1.0000 | ORAL_TABLET | Freq: Every day | ORAL | 1 refills | Status: DC

## 2019-03-17 MED ORDER — AMLODIPINE BESYLATE 5 MG PO TABS: 5.0000 mg | ORAL_TABLET | Freq: Every day | ORAL | 3 refills | Status: DC

## 2019-03-17 MED ORDER — MOMETASONE FUROATE 50 MCG/ACT NA SUSP: 2.0000 | Freq: Every day | NASAL | 0 refills | Status: DC

## 2019-03-17 NOTE — Progress Notes (Signed)
Name: Tristan Miller   MRN: [id number]    DOB: 10-Apr-1956   Date:03/17/2019       Progress Note  Subjective:    Chief Complaint  Chief Complaint  Patient presents with  . Follow-up  . Hypertension  . Hyperlipidemia  . Medication Refill    I connected with  [name]  on 03/17/19 at 10:00 AM EDT by a video enabled telemedicine application and verified that I am speaking with the correct person using two identifiers.  I discussed the limitations of evaluation and management by telemedicine and the availability of in person appointments. The patient expressed understanding and agreed to proceed. Staff also discussed with the patient that there may be a patient responsible charge related to this service. Patient Location: home Provider Location: [facility] clinic Additional Individuals present: none  HPI  Hypertension:  Pt diagnosed with HTN >5 years ago Currently managed on norvasc 5 mg, benicar 40-25 mg Pt reports excellent med compliance and denies any SE.  No lightheadedness, hypotension, syncope. Blood pressure - virtual visit, unable to do vitals today, his home BP cuff is not working BP Readings from Last 3 Encounters:  08/12/18 122/80  05/14/18 130/82  04/08/18 138/76   Pt denies CP, SOB, exertional sx, LE edema, palpitation, Ha's, visual disturbances Dietary efforts for BP?  Previously advised to do DASH diet, he is still careful to not add salt.  Hyperlipidemia: Current Medication Regimen: Last Lipids: Lab Results  Component Value Date   CHOL 141 08/12/2018   HDL 37 (L) 08/12/2018   LDLCALC 83 08/12/2018   TRIG 114 08/12/2018   CHOLHDL 3.8 08/12/2018   - Dietary efforts:  He is eating more fast food through the drive through since COVID pandemic   - Denies: Chest pain, shortness of breath, myalgias. - Documented aortic atherosclerosis? No - Risk factors for atherosclerosis: hypercholesterolemia and hypertension   Obesity: Not on a scale - didn't do  weight today Wt Readings from Last 5 Encounters:  08/12/18 297 lb (134.7 kg)  05/14/18 297 lb (134.7 kg)  04/08/18 296 lb 4.8 oz (134.4 kg)  11/09/16 288 lb (130.6 kg)  10/31/16 288 lb 1.6 oz (130.7 kg)   BMI Readings from Last 5 Encounters:  08/12/18 41.42 kg/m  05/14/18 41.42 kg/m  04/08/18 41.33 kg/m  11/09/16 41.32 kg/m  10/31/16 41.34 kg/m  does not give me a new weight today, he is trying to work on things, I suspect with a recent knee injury and some poor food choices with COVID that he may have gained weight.  He tried to cut out pepsi but started drinking sweat tea.  He was exercising but then about a month ago injured his knee and he has been icing it and resting it waiting for it to gradually get better.  He denies going anywhere to get it evaluated and today does not want a   Prediabetes: DM dx 4  years ago Currently managing with diet and exercise/lifestyle Recent pertinent labs: Lab Results  Component Value Date   HGBA1C 6.3 (H) 08/12/2018   HGBA1C 5.9 04/17/2016   HGBA1C 5.9 05/02/2015    Patient Active Problem List   Diagnosis Date Noted  . Morbid obesity (HCC) 05/14/2018  . Screening for colon cancer 10/31/2016  . Annual physical exam 07/17/2016  . Pre-diabetes 04/17/2016  . Need for influenza vaccination 05/02/2015  . OSA (obstructive sleep apnea) 05/02/2015  . Hyperglycemia 05/02/2015  . Essential hypertension 05/02/2015  . Hyperlipidemia 05/02/2015  Past Surgical History:  Procedure Laterality Date  . NO PAST SURGERIES      History reviewed. No pertinent family history.  Social History   Socioeconomic History  . Marital status: Married    Spouse name: Not on file  . Number of children: Not on file  . Years of education: Not on file  . Highest education level: Not on file  Occupational History  . Not on file  Social Needs  . Financial resource strain: Not on file  . Food insecurity    Worry: Not on file    Inability: Not on file   . Transportation needs    Medical: Not on file    Non-medical: Not on file  Tobacco Use  . Smoking status: Never Smoker  . Smokeless tobacco: Never Used  Substance and Sexual Activity  . Alcohol use: No    Alcohol/week: 0.0 standard drinks    Comment: quit 30 years ago  . Drug use: No  . Sexual activity: Yes    Partners: Female  Lifestyle  . Physical activity    Days per week: Not on file    Minutes per session: Not on file  . Stress: Not on file  Relationships  . Social Musician on phone: Not on file    Gets together: Not on file    Attends religious service: Not on file    Active member of club or organization: Not on file    Attends meetings of clubs or organizations: Not on file    Relationship status: Not on file  . Intimate partner violence    Fear of current or ex partner: Not on file    Emotionally abused: Not on file    Physically abused: Not on file    Forced sexual activity: Not on file  Other Topics Concern  . Not on file  Social History Narrative  . Not on file     Current Outpatient Medications:  .  amLODipine (NORVASC) 5 MG tablet, TAKE 1 TABLET BY MOUTH EVERY DAY, Disp: 90 tablet, Rfl: 3 .  aspirin EC 81 MG tablet, Take 81 mg by mouth daily., Disp: , Rfl:  .  atorvastatin (LIPITOR) 40 MG tablet, Take 1 tablet (40 mg total) by mouth daily., Disp: 90 tablet, Rfl: 1 .  mometasone (NASONEX) 50 MCG/ACT nasal spray, PLACE 2 SPRAYS INTO THE NOSE DAILY., Disp: 1 g, Rfl: 0 .  olmesartan-hydrochlorothiazide (BENICAR HCT) 40-25 MG tablet, TAKE 1 TABLET BY MOUTH EVERY DAY, Disp: 30 tablet, Rfl: 0  No Known Allergies  I personally reviewed active problem list, medication list, allergies, family history, social history, health maintenance, notes from last encounter, lab results, imaging with the patient/caregiver today.  Review of Systems  Constitutional: Negative.   HENT: Negative.   Eyes: Negative.   Respiratory: Negative.   Cardiovascular:  Negative.   Gastrointestinal: Negative.   Endocrine: Negative.   Genitourinary: Negative.   Musculoskeletal: Negative.   Skin: Negative.   Allergic/Immunologic: Negative.   Neurological: Negative.   Hematological: Negative.   Psychiatric/Behavioral: Negative.   All other systems reviewed and are negative.     Objective:    Virtual encounter, vitals limited, only able to obtain the following There were no vitals filed for this visit. There is no height or weight on file to calculate BMI. Nursing Note and Vital Signs reviewed.  Physical Exam Vitals signs and nursing note reviewed.  Constitutional:      General: He is not  in acute distress.    Appearance: Normal appearance. He is well-developed. He is obese. He is not ill-appearing, toxic-appearing or diaphoretic.  HENT:     Head: Normocephalic and atraumatic.     Right Ear: External ear normal.     Left Ear: External ear normal.     Nose: Nose normal.  Eyes:     General: No scleral icterus.       Right eye: No discharge.        Left eye: No discharge.     Conjunctiva/sclera: Conjunctivae normal.  Neck:     Trachea: No tracheal deviation.  Cardiovascular:     Rate and Rhythm: Normal rate.  Pulmonary:     Effort: Pulmonary effort is normal. No respiratory distress.     Breath sounds: No stridor.     Comments: No respiratory distress, audible wheeze or stridor Musculoskeletal: Normal range of motion.  Skin:    Coloration: Skin is not jaundiced.     Findings: No rash.  Neurological:     Mental Status: He is alert.     Motor: No abnormal muscle tone.     Coordination: Coordination normal.  Psychiatric:        Behavior: Behavior normal.     PE limited by telephone encounter  No results found for this or any previous visit (from the past 72 hour(s)).  PH[facility] 03/17/2019 08/12/2018 05/14/2018 04/08/2018 10/31/2016  Decreased Interest 0 0 0 0 0  Down, Depressed, Hopeless 0 0 0 0 0  PHQ - 2  Score 0 0 0 0 0  Altered sleeping 0 0 0 0 -  Tired, decreased energy 0 0 0 0 -  Change in appetite 0 0 0 0 -  Feeling bad or failure about yourself  0 0 0 0 -  Trouble concentrating 0 0 0 0 -  Moving slowly or fidgety/restless 0 0 0 0 -  Suicidal thoughts 0 0 0 0 -  PHQ-9 Score 0 0 0 0 -  Difficult doing work/chores Not difficult at all Not difficult at all Not difficult at all Not difficult at all -   PHQ-2/9 Result is negative.    Fall Risk: Fall Risk  03/17/2019 08/12/2018 05/14/2018 04/08/2018 10/31/2016  Falls in the past year? 0 0 0 No No  Number falls in past yr: 0 - 0 - -  Injury with Fall? 0 - - - -     Assessment and Plan:   1. Essential hypertension Compliant with meds, needs labs, continue working on lifestyle efforts, needs vital signs in clinic - olmesartan-hydrochlorothiazide (BENICAR HCT) 40-25 MG tablet; Take 1 tablet by mouth daily.  Dispense: 90 tablet; Refill: 1 - COMPLETE METABOLIC PANEL WITH GFR - amLODipine (NORVASC) 5 MG tablet; Take 1 tablet (5 mg total) by mouth daily.  Dispense: 90 tablet; Refill: 3  2. Hyperlipidemia, unspecified hyperlipidemia type Compliant with meds, no concerns needs lab work, continue to work on diet and exercise - atorvastatin (LIPITOR) 40 MG tablet; Take 1 tablet (40 mg total) by mouth daily.  Dispense: 90 tablet; Refill: 1 - Lipid panel  3. Prediabetes Needs improvement in diet and exercise, will recheck labs - Lipid panel - COMPLETE METABOLIC PANEL WITH GFR - Hemoglobin A1c  4. Allergic rhinitis, unspecified seasonality, unspecified trigger Has some allergies encouraged use of Nasonex and second-generation antihistamine - mometasone (NASONEX) 50 MCG/ACT nasal spray; Place 2 sprays into the nose daily.  Dispense: 1 g; Refill: 0  5.  Morbid obesity (HCC) Needs calorie deficit, has some limited exercise ability at this time - Lipid panel - COMPLETE METABOLIC PANEL WITH GFR - Hemoglobin A1c    I discussed the assessment  and treatment plan with the patient. The patient was provided an opportunity to ask questions and all were answered. The patient agreed with the plan and demonstrated an understanding of the instructions.  The patient was advised to call back or seek an in-person evaluation if the symptoms worsen or if the condition fails to improve as anticipated.  I provided 20 minutes of non-face-to-face time during this encounter.  Delsa Grana, PA-C 03/16/2009:13 AM

## 2019-03-18 ENCOUNTER — Encounter: Payer: Self-pay | Admitting: Family Medicine

## 2019-04-08 ENCOUNTER — Telehealth: Payer: Self-pay

## 2019-04-08 DIAGNOSIS — M25569 Pain in unspecified knee: Secondary | ICD-10-CM

## 2019-04-08 DIAGNOSIS — M25561 Pain in right knee: Secondary | ICD-10-CM

## 2019-04-08 NOTE — Telephone Encounter (Signed)
Copied from [place] [id number]. Topic: General - Other >> Apr 08, 2019  1:20 PM Rayann Heman wrote: Reason for CRM: pt called and stated that he would like an ortho referral for knee pain. Pt states he discussed this with provider

## 2019-04-08 NOTE — Telephone Encounter (Signed)
He briefly mentioned it when I asked about exercise and diet for his routine f/up stuff. Can you please ask him which knee?  I wrote down general knee pain preventing exercising - not any HPI about it. I'll put in referral with the laterality.  thanks

## 2019-04-08 NOTE — Telephone Encounter (Signed)
Is this ok?

## 2019-04-10 ENCOUNTER — Ambulatory Visit (INDEPENDENT_AMBULATORY_CARE_PROVIDER_SITE_OTHER): Payer: BC Managed Care – PPO | Admitting: Family Medicine

## 2019-04-10 ENCOUNTER — Other Ambulatory Visit: Payer: Self-pay

## 2019-04-10 ENCOUNTER — Encounter: Payer: Self-pay | Admitting: Family Medicine

## 2019-04-10 DIAGNOSIS — N3000 Acute cystitis without hematuria: Secondary | ICD-10-CM | POA: Diagnosis not present

## 2019-04-10 MED ORDER — SULFAMETHOXAZOLE-TRIMETHOPRIM 800-160 MG PO TABS: 1.0000 | ORAL_TABLET | Freq: Two times a day (BID) | ORAL | 0 refills | Status: AC

## 2019-04-10 NOTE — Progress Notes (Signed)
Name: Tristan Miller   MRN: [id number]    DOB: 1956-02-09   Date:04/10/2019       Progress Note  Subjective  Chief Complaint  Chief Complaint  Patient presents with  . Urinary Frequency    burning when urinating, feels like he got to go all the time    I connected with  Tristan Miller on 04/10/19 at  2:20 PM EDT by telephone and verified that I am speaking with the correct person using two identifiers.   I discussed the limitations, risks, security and privacy concerns of performing an evaluation and management service by telephone and the availability of in person appointments. Staff also discussed with the patient that there may be a patient responsible charge related to this service. Patient Location: Home Provider Location: Office Additional Individuals present: None  HPI  Pt presents with concern for several days of dysuria and urinary frequency.  He does note a history of kidney stones, but never UTI in the past.  Denies abdominal pain, fevers/chills, frank hematuria, back pain, changes in BM, polyphagia or polydipsia; has steady urinary stream, not pushing/straining to start urine stream. No history of prostatitis.  Does note foul odor to the urine, urine is also darker than normal. No new sexual partners.   Patient Active Problem List   Diagnosis Date Noted  . Morbid obesity (HCC) 05/14/2018  . Screening for colon cancer 10/31/2016  . Annual physical exam 07/17/2016  . Pre-diabetes 04/17/2016  . Need for influenza vaccination 05/02/2015  . OSA (obstructive sleep apnea) 05/02/2015  . Hyperglycemia 05/02/2015  . Essential hypertension 05/02/2015  . Hyperlipidemia 05/02/2015    Social History   Tobacco Use  . Smoking status: Never Smoker  . Smokeless tobacco: Never Used  Substance Use Topics  . Alcohol use: No    Alcohol/week: 0.0 standard drinks    Comment: quit 30 years ago     Current Outpatient Medications:  .  amLODipine (NORVASC) 5 MG tablet, Take  1 tablet (5 mg total) by mouth daily., Disp: 90 tablet, Rfl: 3 .  aspirin EC 81 MG tablet, Take 81 mg by mouth daily., Disp: , Rfl:  .  atorvastatin (LIPITOR) 40 MG tablet, Take 1 tablet (40 mg total) by mouth daily., Disp: 90 tablet, Rfl: 1 .  mometasone (NASONEX) 50 MCG/ACT nasal spray, Place 2 sprays into the nose daily., Disp: 1 g, Rfl: 0 .  olmesartan-hydrochlorothiazide (BENICAR HCT) 40-25 MG tablet, Take 1 tablet by mouth daily., Disp: 90 tablet, Rfl: 1  No Known Allergies  I personally reviewed active problem list, medication list, allergies, notes from last encounter, lab results with the patient/caregiver today.  ROS  Ten systems reviewed and is negative except as mentioned in HPI  Objective  Virtual encounter, vitals not obtained.  There is no height or weight on file to calculate BMI.  Nursing Note and Vital Signs reviewed.  Physical Exam  Pulmonary/Chest: Effort normal. No respiratory distress. Speaking in complete sentences Neurological: Pt is alert and oriented to person, place, and time. Coordination, speech and gait are normal.  Psychiatric: Patient has a normal mood and affect. behavior is normal. Judgment and thought content normal.  No results found for this or any previous visit (from the past 72 hour(s)).  Assessment & Plan  1. Acute cystitis without hematuria - Will treat based on symptoms, will drop off specimen to ensure we evaluate for hematuria and/or antibiotic resistance. He is aware that I am unable to assess prostate  and do a full examination via telephone, red flags discussed in detail. - sulfamethoxazole-trimethoprim (BACTRIM DS) 800-160 MG tablet; Take 1 tablet by mouth 2 (two) times daily for 7 days.  Dispense: 14 tablet; Refill: 0 - Urinalysis, Complete - Urine Culture  -Red flags and when to present for emergency care or RTC including fever >101.63F, chest pain, shortness of breath, new/worsening/un-resolving symptoms, back pain, frank  hematuria, fevers/malaise, body aches reviewed with patient at time of visit. Follow up and care instructions discussed and provided in AVS. - I discussed the assessment and treatment plan with the patient. The patient was provided an opportunity to ask questions and all were answered. The patient agreed with the plan and demonstrated an understanding of the instructions.  - The patient was advised to call back or seek an in-person evaluation if the symptoms worsen or if the condition fails to improve as anticipated.  I provided 13 minutes of non-face-to-face time during this encounter.  Hubbard Hartshorn, FNP

## 2019-04-15 LAB — URINALYSIS, COMPLETE
Bacteria, UA: NONE SEEN /HPF
Bilirubin Urine: NEGATIVE
Glucose, UA: NEGATIVE
Hgb urine dipstick: NEGATIVE
Hyaline Cast: NONE SEEN /LPF
Ketones, ur: NEGATIVE
Nitrite: NEGATIVE
Protein, ur: NEGATIVE
RBC / HPF: NONE SEEN /HPF (ref 0–2)
Specific Gravity, Urine: 1.017 (ref 1.001–1.03)
Squamous Epithelial / HPF: NONE SEEN /HPF (ref <=5)
pH: 6.5 (ref 5.0–8.0)

## 2019-04-15 LAB — URINE CULTURE
MICRO NUMBER:: 1004988
Result:: NO GROWTH
SPECIMEN QUALITY:: ADEQUATE

## 2019-04-28 ENCOUNTER — Other Ambulatory Visit: Payer: Self-pay

## 2019-04-28 ENCOUNTER — Ambulatory Visit (INDEPENDENT_AMBULATORY_CARE_PROVIDER_SITE_OTHER): Payer: BC Managed Care – PPO

## 2019-04-28 DIAGNOSIS — Z23 Encounter for immunization: Secondary | ICD-10-CM | POA: Diagnosis not present

## 2019-09-30 ENCOUNTER — Other Ambulatory Visit: Payer: Self-pay | Admitting: Family Medicine

## 2019-09-30 DIAGNOSIS — I10 Essential (primary) hypertension: Secondary | ICD-10-CM

## 2019-10-01 NOTE — Telephone Encounter (Signed)
Pt needs OV for f/up and has not completed labs from 7 month ago.  90 d supply no refills until f/up visit

## 2019-12-29 ENCOUNTER — Other Ambulatory Visit: Payer: Self-pay | Admitting: Family Medicine

## 2019-12-29 DIAGNOSIS — I10 Essential (primary) hypertension: Secondary | ICD-10-CM

## 2020-01-07 ENCOUNTER — Other Ambulatory Visit: Payer: Self-pay | Admitting: Family Medicine

## 2020-01-07 DIAGNOSIS — I10 Essential (primary) hypertension: Secondary | ICD-10-CM

## 2020-01-07 NOTE — Telephone Encounter (Signed)
Medication Refill - Medication: olmesartan-hydrochlorothiazide (BENICAR HCT) 40-25 MG tablet amLODipine (NORVASC) 5 MG tablet   Has the patient contacted their pharmacy? Yes.   (Agent: If no, request that the patient contact the pharmacy for the refill.) (Agent: If yes, when and what did the pharmacy advise?)  Preferred Pharmacy (with phone number or street name):   [id number] - [place], [place] - [address]  [address] [place] [address]  Phone: [phone] Fax: [phone]     Agent: Please be advised that RX refills may take up to 3 business days. We ask that you follow-up with your pharmacy.

## 2020-01-07 NOTE — Telephone Encounter (Signed)
Requested medication (s) are due for refill today: Yes  Requested medication (s) are on the active medication list: Yes  Last refill:  Benicar  10/01/19  Amlodipine  03/17/19  Future visit scheduled: No  Notes to clinic:  Left pt. A message to call make an appointment.    Requested Prescriptions  Pending Prescriptions Disp Refills   amLODipine (NORVASC) 5 MG tablet 90 tablet 3    Sig: Take 1 tablet (5 mg total) by mouth daily.      Cardiovascular:  Calcium Channel Blockers Failed - 01/07/2020  1:04 PM      Failed - Valid encounter within last 6 months    Recent Outpatient Visits           9 months ago Acute cystitis without hematuria   [facility] Doren Custard, FNP   9 months ago Essential hypertension   [facility] [name], PA-C   1 year ago Mixed hyperlipidemia   [facility] Lada, Janit Bern, MD   1 year ago Essential hypertension   [facility] Lada, Janit Bern, MD   1 year ago Essential hypertension   [facility], Lanora Manis E, NP              Passed - Last BP in normal range    BP Readings from Last 1 Encounters:  08/12/18 122/80            olmesartan-hydrochlorothiazide (BENICAR HCT) 40-25 MG tablet 90 tablet 0    Sig: Take 1 tablet by mouth daily.      Cardiovascular: ARB + Diuretic Combos Failed - 01/07/2020  1:04 PM      Failed - K in normal range and within 180 days    Potassium  Date Value Ref Range Status  04/08/2018 3.5 3.5 - 5.3 mmol/L Final          Failed - Na in normal range and within 180 days    Sodium  Date Value Ref Range Status  04/08/2018 138 135 - 146 mmol/L Final  05/13/2015 137 136 - 144 mmol/L Final          Failed - Cr in normal range and within 180 days    Creat  Date Value Ref Range Status  04/08/2018 1.00 0.70 - 1.25 mg/dL Final    Comment:    For patients >45 years of age, the reference limit for Creatinine is  approximately 13% higher for people identified as African-American. .           Failed - Ca in normal range and within 180 days    Calcium  Date Value Ref Range Status  04/08/2018 9.1 8.6 - 10.3 mg/dL Final          Failed - Valid encounter within last 6 months    Recent Outpatient Visits           9 months ago Acute cystitis without hematuria   [facility] Doren Custard, FNP   9 months ago Essential hypertension   [facility] [name], PA-C   1 year ago Mixed hyperlipidemia   [facility] Lada, Janit Bern, MD   1 year ago Essential hypertension   [facility] Lada, Janit Bern, MD   1 year ago Essential hypertension   [facility] Cheryle Horsfall, NP  Passed - Patient is not pregnant      Passed - Last BP in normal range    BP Readings from Last 1 Encounters:  08/12/18 122/80

## 2020-01-08 ENCOUNTER — Other Ambulatory Visit: Payer: Self-pay

## 2020-01-08 ENCOUNTER — Other Ambulatory Visit: Payer: Self-pay | Admitting: Family Medicine

## 2020-01-08 ENCOUNTER — Encounter: Payer: Self-pay | Admitting: Family Medicine

## 2020-01-08 ENCOUNTER — Ambulatory Visit (INDEPENDENT_AMBULATORY_CARE_PROVIDER_SITE_OTHER): Payer: BC Managed Care – PPO | Admitting: Family Medicine

## 2020-01-08 VITALS — Ht 71.0 in | Wt 297.0 lb

## 2020-01-08 DIAGNOSIS — E782 Mixed hyperlipidemia: Secondary | ICD-10-CM | POA: Diagnosis not present

## 2020-01-08 DIAGNOSIS — Z760 Encounter for issue of repeat prescription: Secondary | ICD-10-CM | POA: Diagnosis not present

## 2020-01-08 DIAGNOSIS — I10 Essential (primary) hypertension: Secondary | ICD-10-CM | POA: Diagnosis not present

## 2020-01-08 MED ORDER — OLMESARTAN MEDOXOMIL-HCTZ 40-25 MG PO TABS: 1.0000 | ORAL_TABLET | Freq: Every day | ORAL | 0 refills | Status: DC

## 2020-01-08 MED ORDER — AMLODIPINE BESYLATE 5 MG PO TABS: 5.0000 mg | ORAL_TABLET | Freq: Every day | ORAL | 0 refills | Status: DC

## 2020-01-08 MED ORDER — AMLODIPINE BESYLATE 5 MG PO TABS
5.0000 mg | ORAL_TABLET | Freq: Every day | ORAL | 0 refills | Status: DC
Start: 2020-01-08 — End: 2020-01-28

## 2020-01-08 NOTE — Progress Notes (Addendum)
Name: Tristan Miller   MRN: [id number]    DOB: 1956/03/26   Date:01/08/2020       Progress Note  Subjective:    Chief Complaint  Chief Complaint  Patient presents with  . Medication Refill    I connected with  [name] on 01/08/20 at  3:00 PM EDT by telephone and verified that I am speaking with the correct person using two identifiers.   I discussed the limitations, risks, security and privacy concerns of performing an evaluation and management service by telephone and the availability of in person appointments. Staff also discussed with the patient that there may be a patient responsible charge related to this service. Pt acknowledged limitation and expressed desire to proceed with telephone visit.  Patient Location: out of town [employer] Location: [facility] Additional Individuals present: wife in background  HPI Pt presents asking for med refills just for over the weekend while out of town.  He is well overdue for in person OV and has had meds recently declined due to need for in person visit and labs. He attempted to ask his pharmacy for short emergency supply, he states they were rude and just kept sending [place] requests. He needs his norvasc and benazepril-HCTZ just for 4 days.  He has been doing well, no SE or sx that he is concerned about.  Last he checked his bp it was around 130/79- 130/80  He agrees to schedule appt in the next couple weeks for routine f/up   Patient Active Problem List   Diagnosis Date Noted  . Morbid obesity (HCC) 05/14/2018  . Screening for colon cancer 10/31/2016  . Annual physical exam 07/17/2016  . Pre-diabetes 04/17/2016  . Need for influenza vaccination 05/02/2015  . OSA (obstructive sleep apnea) 05/02/2015  . Hyperglycemia 05/02/2015  . Essential hypertension 05/02/2015  . Hyperlipidemia 05/02/2015    Social History   Tobacco Use  . Smoking status: Never Smoker  . Smokeless tobacco: Never Used  Substance Use  Topics  . Alcohol use: No    Alcohol/week: 0.0 standard drinks    Comment: quit 30 years ago     Current Outpatient Medications:  .  amLODipine (NORVASC) 5 MG tablet, Take 1 tablet (5 mg total) by mouth daily., Disp: 90 tablet, Rfl: 3 .  aspirin EC 81 MG tablet, Take 81 mg by mouth daily., Disp: , Rfl:  .  atorvastatin (LIPITOR) 40 MG tablet, Take 1 tablet (40 mg total) by mouth daily., Disp: 90 tablet, Rfl: 1 .  mometasone (NASONEX) 50 MCG/ACT nasal spray, Place 2 sprays into the nose daily., Disp: 1 g, Rfl: 0 .  olmesartan-hydrochlorothiazide (BENICAR HCT) 40-25 MG tablet, TAKE 1 TABLET BY MOUTH EVERY DAY, Disp: 90 tablet, Rfl: 0  No Known Allergies  Chart Review: I personally reviewed active problem list, medication list, allergies, family history, social history, health maintenance, notes from last encounter, lab results, imaging with the patient/caregiver today.   Review of Systems 10 Systems reviewed and are negative for acute change except as noted in the HPI.   Objective:    Virtual encounter, vitals limited, only able to obtain the following Today's Vitals   01/08/20 1416  Weight: 297 lb (134.7 kg)  Height: 5\' 11"  (1.803 m)  PainSc: 0-No pain   Body mass index is 41.42 kg/m. Nursing Note and Vital Signs reviewed.  Physical Exam Phonation clear, patient pleasant PE limited by telephone encounter  No results found for this or any  previous visit (from the past 72 hour(s)).  Assessment and Plan:     ICD-10-CM   1. Essential hypertension  I10    needs refills on HTN meds - sent to pharmacy where pt is staying for the weekend - pt due for in office f/up  2. Mixed hyperlipidemia  E78.2    same as above - pt due for refill and in office visit and labs  3. Medication refill  Z76.0 DISCONTINUED: olmesartan-hydrochlorothiazide (BENICAR HCT) 40-25 MG tablet    DISCONTINUED: olmesartan-hydrochlorothiazide (BENICAR HCT) 40-25 MG tablet    DISCONTINUED:  olmesartan-hydrochlorothiazide (BENICAR HCT) 40-25 MG tablet   Med refill sent to out-of-town pharmacy since patient forgot, 30-day supply given and an office visit scheduled  I did look up pharmacies with the patient and his wife I sent a prescription to a nearby [facility] and attempted to send to a [facility] pharmacy close to where they are staying.  There was some errors with the electronic prescription and so they were resent at least 2-3 times today.  Dois Davenport the nurse also helped send but I believe it did go to a local [facility] pharmacy. We discussed getting good Rx to pay for the supply needed since insurance may not cover it.  He did make a appointment in about 2 weeks to follow-up in office and he does understand that he is due for labs in we need to meet in person, get vital signs and do a physical exam to make sure we do proper monitoring and managing of his chronic conditions and of his medicines. He endorses being well his most recent blood pressure that he can recall is near goal he has no concerns side effects or symptoms  HTN- well controlled with current meds, due for labs and OV HLD - still compliant with statin, overdue for labs     - I discussed the assessment and treatment plan with the patient. The patient was provided an opportunity to ask questions and all were answered. The patient agreed with the plan and demonstrated an understanding of the instructions.  - The patient was advised to call back or seek an in-person evaluation if the symptoms worsen or if the condition fails to improve as anticipated.  I provided 30 minutes of non-face-to-face time during this encounter.  [name], PA-C 01/08/20 3:26 PM

## 2020-01-28 ENCOUNTER — Other Ambulatory Visit: Payer: Self-pay

## 2020-01-28 ENCOUNTER — Ambulatory Visit: Payer: BC Managed Care – PPO | Admitting: Family Medicine

## 2020-01-28 ENCOUNTER — Encounter: Payer: Self-pay | Admitting: Family Medicine

## 2020-01-28 VITALS — BP 122/76 | HR 69 | Temp 97.9°F | Resp 18 | Ht 71.0 in | Wt 304.4 lb

## 2020-01-28 DIAGNOSIS — E785 Hyperlipidemia, unspecified: Secondary | ICD-10-CM | POA: Diagnosis not present

## 2020-01-28 DIAGNOSIS — Z6841 Body Mass Index (BMI) 40.0 and over, adult: Secondary | ICD-10-CM

## 2020-01-28 DIAGNOSIS — I10 Essential (primary) hypertension: Secondary | ICD-10-CM

## 2020-01-28 DIAGNOSIS — Z5181 Encounter for therapeutic drug level monitoring: Secondary | ICD-10-CM

## 2020-01-28 DIAGNOSIS — R7303 Prediabetes: Secondary | ICD-10-CM | POA: Diagnosis not present

## 2020-01-28 MED ORDER — AMLODIPINE BESYLATE 5 MG PO TABS: 5.0000 mg | ORAL_TABLET | Freq: Every day | ORAL | 3 refills | Status: DC

## 2020-01-28 MED ORDER — OLMESARTAN MEDOXOMIL-HCTZ 40-25 MG PO TABS: 1.0000 | ORAL_TABLET | Freq: Every day | ORAL | 3 refills | Status: DC

## 2020-01-28 MED ORDER — ATORVASTATIN CALCIUM 40 MG PO TABS: 40.0000 mg | ORAL_TABLET | Freq: Every day | ORAL | 3 refills | Status: DC

## 2020-01-28 NOTE — Progress Notes (Signed)
Name: Tristan Miller   MRN: [id number]    DOB: 1956-01-22   Date:01/28/2020       Progress Note  Chief Complaint  Patient presents with  . Follow-up  . Hypertension  . Hyperlipidemia     Subjective:   Tristan Miller is a 64 y.o. male, presents to clinic for   Hypertension:  Pt diagnosed with HTN ago Currently managed on Norvasc 5 mg daily and on losartan hydrochlorothiazide  pt reports good med compliance and denies any SE.  No lightheadedness, hypotension, syncope. Blood pressure today is well controlled. BP Readings from Last 3 Encounters:  01/28/20 122/76  08/12/18 122/80  05/14/18 130/82   Pt denies CP, SOB, exertional sx, LE edema, palpitation, Ha's, visual disturbances Dietary efforts for BP?  Trying to eat healthy and exercise   Has increased weight from recent vacation but patient notes try to get back on track with healthier diet and exercise BMI >40 with comorbidities of hypertension and hyperlipidemia, he is not been diagnosed with diabetes in the past- but hx of prediabetes per chart review Wt Readings from Last 5 Encounters:  01/28/20 (!) 304 lb 6.4 oz (138.1 kg)  01/08/20 297 lb (134.7 kg)  08/12/18 297 lb (134.7 kg)  05/14/18 297 lb (134.7 kg)  04/08/18 296 lb 4.8 oz (134.4 kg)   BMI Readings from Last 5 Encounters:  01/28/20 42.46 kg/m  01/08/20 41.42 kg/m  08/12/18 41.42 kg/m  05/14/18 41.42 kg/m  04/08/18 41.33 kg/m   Hyperlipidemia: Currently treated with atorvastatin but he has been out of medications for a while Last cholesterol panel was February 2020 her LDL was well controlled and HDL has been low When he was taking statin he did tolerate without any myalgias fatigue or any other concerns or side effects - Denies: Chest pain, shortness of breath, myalgias, claudication      Current Outpatient Medications:  .  amLODipine (NORVASC) 5 MG tablet, Take 1 tablet (5 mg total) by mouth daily., Disp: 90 tablet, Rfl: 3 .  aspirin EC  81 MG tablet, Take 81 mg by mouth daily., Disp: , Rfl:  .  atorvastatin (LIPITOR) 40 MG tablet, Take 1 tablet (40 mg total) by mouth daily., Disp: 90 tablet, Rfl: 1 .  mometasone (NASONEX) 50 MCG/ACT nasal spray, Place 2 sprays into the nose daily., Disp: 1 g, Rfl: 0 .  olmesartan-hydrochlorothiazide (BENICAR HCT) 40-25 MG tablet, Take 1 tablet by mouth daily., Disp: 30 tablet, Rfl: 0 .  amLODipine (NORVASC) 5 MG tablet, Take 1 tablet (5 mg total) by mouth daily. (Patient not taking: Reported on 01/28/2020), Disp: 30 tablet, Rfl: 0 .  amLODipine (NORVASC) 5 MG tablet, Take 1 tablet (5 mg total) by mouth daily. (Patient not taking: Reported on 01/28/2020), Disp: 30 tablet, Rfl: 0 .  olmesartan-hydrochlorothiazide (BENICAR HCT) 40-25 MG tablet, Take 1 tablet by mouth daily. (Patient not taking: Reported on 01/28/2020), Disp: 30 tablet, Rfl: 0  Patient Active Problem List   Diagnosis Date Noted  . Morbid obesity (HCC) 05/14/2018  . Screening for colon cancer 10/31/2016  . Annual physical exam 07/17/2016  . Pre-diabetes 04/17/2016  . Need for influenza vaccination 05/02/2015  . OSA (obstructive sleep apnea) 05/02/2015  . Hyperglycemia 05/02/2015  . Essential hypertension 05/02/2015  . Hyperlipidemia 05/02/2015    Past Surgical History:  Procedure Laterality Date  . NO PAST SURGERIES      No family history on file.  Social History   Tobacco Use  . Smoking  status: Never Smoker  . Smokeless tobacco: Never Used  Substance Use Topics  . Alcohol use: No    Alcohol/week: 0.0 standard drinks    Comment: quit 30 years ago  . Drug use: No     No Known Allergies  Health Maintenance  Topic Date Due  . HIV Screening  Never done  . Fecal DNA (Cologuard)  Never done  . INFLUENZA VACCINE  01/24/2020  . TETANUS/TDAP  04/08/2028  . COVID-19 Vaccine  Completed  . Hepatitis C Screening  Completed    Chart Review Today: I personally reviewed active problem list, medication list, allergies,  family history, social history, health maintenance, notes from last encounter, lab results, imaging with the patient/caregiver today.   Review of Systems  10 Systems reviewed and are negative for acute change except as noted in the HPI.  Objective:   Vitals:   01/28/20 1018  BP: 122/76  Pulse: 69  Resp: 18  Temp: 97.9 F (36.6 C)  TempSrc: Temporal  SpO2: 98%  Weight: (!) 304 lb 6.4 oz (138.1 kg)  Height: 5\' 11"  (1.803 m)    Body mass index is 42.46 kg/m.  Physical Exam Vitals and nursing note reviewed.  Constitutional:      General: He is not in acute distress.    Appearance: Normal appearance. He is well-developed. He is obese. He is not ill-appearing, toxic-appearing or diaphoretic.     Interventions: Face mask in place.  HENT:     Head: Normocephalic and atraumatic.     Jaw: No trismus.     Right Ear: External ear normal.     Left Ear: External ear normal.  Eyes:     General: Lids are normal. No scleral icterus.       Right eye: No discharge.        Left eye: No discharge.     Conjunctiva/sclera: Conjunctivae normal.  Neck:     Trachea: Trachea and phonation normal. No tracheal deviation.  Cardiovascular:     Rate and Rhythm: Normal rate and regular rhythm.     Pulses: Normal pulses.          Radial pulses are 2+ on the right side and 2+ on the left side.       Posterior tibial pulses are 2+ on the right side and 2+ on the left side.     Heart sounds: Normal heart sounds. No murmur heard.  No friction rub. No gallop.   Pulmonary:     Effort: Pulmonary effort is normal. No respiratory distress.     Breath sounds: Normal breath sounds. No stridor. No wheezing, rhonchi or rales.  Abdominal:     General: Bowel sounds are normal. There is no distension.     Palpations: Abdomen is soft.  Musculoskeletal:     Right lower leg: No edema.     Left lower leg: No edema.  Skin:    General: Skin is warm and dry.     Coloration: Skin is not jaundiced.     Findings:  No rash.     Nails: There is no clubbing.  Neurological:     Mental Status: He is alert. Mental status is at baseline.     Cranial Nerves: No dysarthria or facial asymmetry.     Motor: No tremor or abnormal muscle tone.     Gait: Gait normal.  Psychiatric:        Mood and Affect: Mood normal.        Speech:  Speech normal.        Behavior: Behavior normal. Behavior is cooperative.         Assessment & Plan:   1. Essential hypertension Patient was able to get his refills and get back on all of his blood pressure medication without any side effects or concerns.  His blood pressure is stable and well-controlled today.  He is overdue for labs will check renal function and electrolytes Encouraged healthy diet, lifestyle, weight loss if able, DASH reviewed - amLODipine (NORVASC) 5 MG tablet; Take 1 tablet (5 mg total) by mouth daily.  Dispense: 90 tablet; Refill: 3 - olmesartan-hydrochlorothiazide (BENICAR HCT) 40-25 MG tablet; Take 1 tablet by mouth daily.  Dispense: 90 tablet; Refill: 3 - COMPLETE METABOLIC PANEL WITH GFR  2. Hyperlipidemia, unspecified hyperlipidemia type Pt recently restarted statin, he had been out so it will not be surprising if his LDL is higher than what it was previously which was about 18 months ago lipid panel was well controlled.  Reviewed hyperlipidemia dietary efforts encouraged avoiding foods high in transfat saturated fat and overall reducing calories, processed food and exercising and losing some weight which even a 3 to 5% weight loss would improve his heart health and his cholesterol. - atorvastatin (LIPITOR) 40 MG tablet; Take 1 tablet (40 mg total) by mouth daily.  Dispense: 90 tablet; Refill: 3 - Lipid panel - COMPLETE METABOLIC PANEL WITH GFR  3. Prediabetes -recheck labs - Lipid panel - COMPLETE METABOLIC PANEL WITH GFR - Hemoglobin A1c  4. Class 3 severe obesity with serious comorbidity and body mass index (BMI) of 40.0 to 44.9 in adult,  unspecified obesity type (HCC) Encourage healthy diet, lifestyle, increasing exercise and decreasing calories and portions -discussed options available such as referral to nutritionist, medical weight management referral to specialist - Lipid panel - COMPLETE METABOLIC PANEL WITH GFR - Hemoglobin A1c - CBC with Differential/Platelet  5. Encounter for medication monitoring - amLODipine (NORVASC) 5 MG tablet; Take 1 tablet (5 mg total) by mouth daily.  Dispense: 90 tablet; Refill: 3 - atorvastatin (LIPITOR) 40 MG tablet; Take 1 tablet (40 mg total) by mouth daily.  Dispense: 90 tablet; Refill: 3 - olmesartan-hydrochlorothiazide (BENICAR HCT) 40-25 MG tablet; Take 1 tablet by mouth daily.  Dispense: 90 tablet; Refill: 3 - Lipid panel - COMPLETE METABOLIC PANEL WITH GFR - Hemoglobin A1c - CBC with Differential/Platelet   Return in about 6 months (around 07/30/2020) for Routine follow-up, Annual Physical.   [name], PA-C 01/28/20 10:48 AM

## 2020-01-29 LAB — CBC WITH DIFFERENTIAL/PLATELET
Absolute Monocytes: 634 cells/uL (ref 200–950)
Basophils Absolute: 19 cells/uL (ref 0–200)
Basophils Relative: 0.3 %
Eosinophils Absolute: 70 cells/uL (ref 15–500)
Eosinophils Relative: 1.1 %
HCT: 42.4 % (ref 38.5–50.0)
Hemoglobin: 13.8 g/dL (ref 13.2–17.1)
Lymphs Abs: 2054 cells/uL (ref 850–3900)
MCH: 26.5 pg — ABNORMAL LOW (ref 27.0–33.0)
MCHC: 32.5 g/dL (ref 32.0–36.0)
MCV: 81.5 fL (ref 80.0–100.0)
MPV: 11.1 fL (ref 7.5–12.5)
Monocytes Relative: 9.9 %
Neutro Abs: 3622 cells/uL (ref 1500–7800)
Neutrophils Relative %: 56.6 %
Platelets: 199 10*3/uL (ref 140–400)
RBC: 5.2 10*6/uL (ref 4.20–5.80)
RDW: 13.4 % (ref 11.0–15.0)
Total Lymphocyte: 32.1 %
WBC: 6.4 10*3/uL (ref 3.8–10.8)

## 2020-01-29 LAB — COMPLETE METABOLIC PANEL WITH GFR
AG Ratio: 1.4 (calc) (ref 1.0–2.5)
ALT: 22 U/L (ref 9–46)
AST: 21 U/L (ref 10–35)
Albumin: 3.8 g/dL (ref 3.6–5.1)
Alkaline phosphatase (APISO): 60 U/L (ref 35–144)
BUN: 16 mg/dL (ref 7–25)
CO2: 31 mmol/L (ref 20–32)
Calcium: 9.3 mg/dL (ref 8.6–10.3)
Chloride: 105 mmol/L (ref 98–110)
Creat: 1.09 mg/dL (ref 0.70–1.25)
GFR, Est African American: 84 mL/min/{1.73_m2} (ref >=60)
GFR, Est Non African American: 72 mL/min/{1.73_m2} (ref >=60)
Globulin: 2.7 g/dL (calc) (ref 1.9–3.7)
Glucose, Bld: 105 mg/dL — ABNORMAL HIGH (ref 65–99)
Potassium: 4 mmol/L (ref 3.5–5.3)
Sodium: 142 mmol/L (ref 135–146)
Total Bilirubin: 0.5 mg/dL (ref 0.2–1.2)
Total Protein: 6.5 g/dL (ref 6.1–8.1)

## 2020-01-29 LAB — HEMOGLOBIN A1C
Hgb A1c MFr Bld: 6.2 % of total Hgb — ABNORMAL HIGH (ref <5.7)
Mean Plasma Glucose: 131 (calc)
eAG (mmol/L): 7.3 (calc)

## 2020-01-29 LAB — LIPID PANEL
Cholesterol: 182 mg/dL (ref <200)
HDL: 40 mg/dL (ref >=40)
LDL Cholesterol (Calc): 123 mg/dL (calc) — ABNORMAL HIGH
Non-HDL Cholesterol (Calc): 142 mg/dL (calc) — ABNORMAL HIGH (ref <130)
Total CHOL/HDL Ratio: 4.6 (calc) (ref <5.0)
Triglycerides: 89 mg/dL (ref <150)

## 2020-02-23 ENCOUNTER — Encounter: Payer: Self-pay | Admitting: Family Medicine

## 2020-04-29 ENCOUNTER — Encounter: Payer: BC Managed Care – PPO | Admitting: Family Medicine

## 2020-05-11 ENCOUNTER — Ambulatory Visit: Payer: BC Managed Care – PPO

## 2020-05-11 ENCOUNTER — Other Ambulatory Visit: Payer: Self-pay

## 2020-05-11 ENCOUNTER — Ambulatory Visit (INDEPENDENT_AMBULATORY_CARE_PROVIDER_SITE_OTHER): Payer: BC Managed Care – PPO

## 2020-05-11 DIAGNOSIS — Z23 Encounter for immunization: Secondary | ICD-10-CM

## 2020-06-01 ENCOUNTER — Encounter: Payer: BC Managed Care – PPO | Admitting: Internal Medicine

## 2020-06-07 NOTE — Progress Notes (Deleted)
Patient is a 64 year old male patient of [name] Last visit with her was 01/28/2020 Communication from lab results done at that visit included the following:  A1C was stable - 6.2 still prediabetes range Blood sugar a little high - work on reducing carbs/sweets/calorie intake No anemia, kidney/liver function and electrolytes normal/good Cholesterol was high - work on taking his statin daily and working on [employer] and exercise  Presents today for an annual physical  He has been followed by [name] for hypertension, hyperlipidemia, prediabetes, obesity  Hypertension:  Pt diagnosed with HTN ago Currently managed on Norvasc 5 mg daily and on losartan hydrochlorothiazide  pt reports good med compliance and denies any SE.  No lightheadedness, hypotension, syncope. Blood pressure today is well controlled.    BP Readings from Last 3 Encounters:  01/28/20 122/76  08/12/18 122/80  05/14/18 130/82   Pt denies CP, SOB, exertional sx, LE edema, palpitation, Ha's, visual disturbance  Obesity Has increased weight in recent past    Wt Readings from Last 5 Encounters:  01/28/20 (!) 304 lb 6.4 oz (138.1 kg)  01/08/20 297 lb (134.7 kg)  08/12/18 297 lb (134.7 kg)  05/14/18 297 lb (134.7 kg)  04/08/18 296 lb 4.8 oz (134.4 kg)      BMI Readings from Last 5 Encounters:  01/28/20 42.46 kg/m  01/08/20 41.42 kg/m  08/12/18 41.42 kg/m  05/14/18 41.42 kg/m  04/08/18 41.33 kg/m   Hyperlipidemia: Currently treated with atorvastatin but he has been out of medications for a while Last cholesterol panel was February 2020 ,  LDL was well controlled and HDL has been low When he was taking statin he did tolerate without any myalgias fatigue or any other concerns or side effects - Denies: Chest pain, shortness of breath, myalgias, claudication Lab Results  Component Value Date   CHOL 182 01/28/2020   HDL 40 01/28/2020   LDLCALC 123 (H) 01/28/2020   TRIG 89 01/28/2020   CHOLHDL 4.6  01/28/2020        1. Essential hypertension Patient was able to get his refills and get back on all of his blood pressure medication without any side effects or concerns.  His blood pressure is stable and well-controlled today.  He is overdue for labs will check renal function and electrolytes Encouraged healthy diet, lifestyle, weight loss if able, DASH reviewed - amLODipine (NORVASC) 5 MG tablet; Take 1 tablet (5 mg total) by mouth daily.  Dispense: 90 tablet; Refill: 3 - olmesartan-hydrochlorothiazide (BENICAR HCT) 40-25 MG tablet; Take 1 tablet by mouth daily.  Dispense: 90 tablet; Refill: 3 - COMPLETE METABOLIC PANEL WITH GFR  2. Hyperlipidemia, unspecified hyperlipidemia type Pt recently restarted statin, he had been out so it will not be surprising if his LDL is higher than what it was previously which was about 18 months ago lipid panel was well controlled.  Reviewed hyperlipidemia dietary efforts encouraged avoiding foods high in transfat saturated fat and overall reducing calories, processed food and exercising and losing some weight which even a 3 to 5% weight loss would improve his heart health and his cholesterol. - atorvastatin (LIPITOR) 40 MG tablet; Take 1 tablet (40 mg total) by mouth daily.  Dispense: 90 tablet; Refill: 3 - Lipid panel - COMPLETE METABOLIC PANEL WITH GFR  3. Prediabetes -recheck labs - Lipid panel - COMPLETE METABOLIC PANEL WITH GFR - Hemoglobin A1c  4. Class 3 severe obesity with serious comorbidity and body mass index (BMI) of 40.0 to 44.9 in adult, unspecified obesity  type (HCC) Encourage healthy diet, lifestyle, increasing exercise and decreasing calories and portions -discussed options available such as referral to nutritionist, medical weight management referral to specialist - Lipid panel - COMPLETE METABOLIC PANEL WITH GFR - Hemoglobin A1c - CBC with Differential/Platelet  5. Encounter for medication monitoring - amLODipine (NORVASC) 5  MG tablet; Take 1 tablet (5 mg total) by mouth daily.  Dispense: 90 tablet; Refill: 3 - atorvastatin (LIPITOR) 40 MG tablet; Take 1 tablet (40 mg total) by mouth daily.  Dispense: 90 tablet; Refill: 3 - olmesartan-hydrochlorothiazide (BENICAR HCT) 40-25 MG tablet; Take 1 tablet by mouth daily.  Dispense: 90 tablet; Refill: 3 - Lipid panel - COMPLETE METABOLIC PANEL WITH GFR - Hemoglobin A1c - CBC with Differential/Platelet   Return in about 6 months (around 07/30/2020) for Routine follow-up, Annual Physical.

## 2020-06-08 ENCOUNTER — Encounter: Payer: BC Managed Care – PPO | Admitting: Internal Medicine

## 2020-06-09 ENCOUNTER — Other Ambulatory Visit: Payer: Self-pay

## 2020-06-09 ENCOUNTER — Encounter: Payer: Self-pay | Admitting: Internal Medicine

## 2020-06-09 ENCOUNTER — Ambulatory Visit (INDEPENDENT_AMBULATORY_CARE_PROVIDER_SITE_OTHER): Payer: BC Managed Care – PPO | Admitting: Internal Medicine

## 2020-06-09 VITALS — BP 128/76 | HR 84 | Temp 98.1°F | Resp 18 | Ht 71.0 in | Wt 296.3 lb

## 2020-06-09 DIAGNOSIS — Z0000 Encounter for general adult medical examination without abnormal findings: Secondary | ICD-10-CM | POA: Diagnosis not present

## 2020-06-09 DIAGNOSIS — I10 Essential (primary) hypertension: Secondary | ICD-10-CM

## 2020-06-09 DIAGNOSIS — E785 Hyperlipidemia, unspecified: Secondary | ICD-10-CM

## 2020-06-09 DIAGNOSIS — R7303 Prediabetes: Secondary | ICD-10-CM | POA: Diagnosis not present

## 2020-06-09 DIAGNOSIS — Z6841 Body Mass Index (BMI) 40.0 and over, adult: Secondary | ICD-10-CM

## 2020-06-09 DIAGNOSIS — M1711 Unilateral primary osteoarthritis, right knee: Secondary | ICD-10-CM

## 2020-06-09 DIAGNOSIS — K429 Umbilical hernia without obstruction or gangrene: Secondary | ICD-10-CM

## 2020-06-09 NOTE — Progress Notes (Signed)
Name: Tristan Miller   MRN: [id number]    DOB: Oct 25, 1955   Date:06/09/2020       Progress Note  Subjective  Chief Complaint  Chief Complaint  Patient presents with  . Annual Exam    HPI Patient is a 64 year old male patient of Tristan Miller Last visit with her was 01/28/2020 Communication from lab results done at that visit included the following:             A1C was stable - 6.2 still prediabetes range Blood sugar a little high - work on reducing carbs/sweets/calorie intake No anemia, kidney/liver function and electrolytes normal/good Cholesterol was high - work on taking his statin daily and working on [employer] and exercise  Presents today for an annual physical In general, feeling pretty good  He has been followed by [facility] for hypertension, hyperlipidemia, prediabetes, obesity  Hypertension:   Currently managed onNorvasc 5 mg daily and on olmesartan -hydrochlorothiazide  pt reportsgoodmed compliance  Checks BP's at home and running 120's/ high 70's-low 80's BP Readings from Last 3 Encounters:  06/09/20 128/76  01/28/20 122/76  08/12/18 122/80   Pt denies CP, SOB, LE edema, palpitation, HA's, visual disturbance  Obesity Has lost some weight in recent past, noted "a long way to go" and encouraged to continue with weight loss attempts and keep working at it slowly over time Wt Readings from Last 3 Encounters:  06/09/20 296 lb 4.8 oz (134.4 kg)  01/28/20 (!) 304 lb 6.4 oz (138.1 kg)  01/08/20 297 lb (134.7 kg)     Hyperlipidemia: Currently treated withatorvastatin - 40 mg, not taking regular, started back to taking in very recent past When he was taking statin he noted at times he felt achy, but felt important to be on this medicine even if felt a little achy at times, and started taking again. I did encourage taking the medicine as well.  Lab Results  Component Value Date   CHOL 182 01/28/2020   HDL 40 01/28/2020   LDLCALC 123 (H) 01/28/2020   TRIG 89  01/28/2020   CHOLHDL 4.6 01/28/2020             Diet:  notes tries to watch and eat healthy, not adherent to a very strict specific diet Exercise:  notes no regular exercise, trying recently and has recumbent bike to use, noted used this 3 times last week although only once so far this week, right knee concerns limiting exercise some in the past. Strongly encouraged exercise in water as an option to try to help stay active and emphasized the importance of regular physical activity.  Depression: phq 9 is negative Depression screen [facility] 2/9 06/09/2020 01/28/2020 01/08/2020 04/10/2019 03/17/2019  Decreased Interest 0 0 0 0 0  Down, Depressed, Hopeless 0 0 0 0 0  PHQ - 2 Score 0 0 0 0 0  Altered sleeping - 0 0 0 0  Tired, decreased energy - 0 0 0 0  Change in appetite - 0 0 0 0  Feeling bad or failure about yourself  - 0 0 0 0  Trouble concentrating - 0 0 0 0  Moving slowly or fidgety/restless - 0 0 0 0  Suicidal thoughts - 0 0 0 0  PHQ-9 Score - 0 0 0 0  Difficult doing work/chores - Not difficult at all - Not difficult at all Not difficult at all  Some recent data might be hidden    Hypertension:  BP Readings from Last 3 Encounters:  01/28/20 122/76  08/12/18 122/80  05/14/18 130/82    Obesity: Wt Readings from Last 3 Encounters:  06/09/20 296 lb 4.8 oz (134.4 kg)  01/28/20 (!) 304 lb 6.4 oz (138.1 kg)  01/08/20 297 lb (134.7 kg)   BMI Readings from Last 3 Encounters:  06/09/20 41.33 kg/m  01/28/20 42.46 kg/m  01/08/20 41.42 kg/m     Lipids:  Lab Results  Component Value Date   CHOL 182 01/28/2020   CHOL 141 08/12/2018   CHOL 222 (H) 04/08/2018   Lab Results  Component Value Date   HDL 40 01/28/2020   HDL 37 (L) 08/12/2018   HDL 42 04/08/2018   Lab Results  Component Value Date   LDLCALC 123 (H) 01/28/2020   LDLCALC 83 08/12/2018   LDLCALC 155 (H) 04/08/2018   Lab Results  Component Value Date   TRIG 89 01/28/2020   TRIG 114 08/12/2018   TRIG 129  04/08/2018   Lab Results  Component Value Date   CHOLHDL 4.6 01/28/2020   CHOLHDL 3.8 08/12/2018   CHOLHDL 5.3 (H) 04/08/2018   No results found for: LDLDIRECT  Glucose:  Glucose, Bld  Date Value Ref Range Status  01/28/2020 105 (H) 65 - 99 mg/dL Final    Comment:    .            Fasting reference interval . For someone without known diabetes, a glucose value between 100 and 125 mg/dL is consistent with prediabetes and should be confirmed with a follow-up test. .   04/08/2018 102 (H) 65 - 99 mg/dL Final    Comment:    .            Fasting reference interval . For someone without known diabetes, a glucose value between 100 and 125 mg/dL is consistent with prediabetes and should be confirmed with a follow-up test. .   10/31/2016 107 (H) 65 - 99 mg/dL Final    Flowsheet Row Office Visit from 06/09/2020 in [facility]  AUDIT-C Score 0     Tob - never smoker Working 3 jobs presently  Married STD testing and prevention (HIV/chl/gon/syphilis): monogamous, no new partners, and no sx's of concern, declined testing Hep C:  Lab Results  Component Value Date   HEPCAB NON-REACTIVE 04/08/2018     Skin cancer: no concerning lesions noted, he noted he struggles sometimes with athlete's foot, and recommended liberal use of the antifungal powders can be helpful.  Colorectal cancer: Denies family history, never had screening, discussed options and he prefers Cologuard and wants to check with insurance and if covered, he will let us know to order. Did note if the Cologuard is positive, a colonoscopy will be needed to help further assess. No recent dark or black stools, bleeding per rectum, chronic abdominal pain  Prostate cancer: Denies family history Has been following PSAs to help for screening, have been okay in the past. Denies hesitancy, urgency, frequency, any hematuria Gets up 0  times a night to urinate, unless takes BP med at nightime Lab  Results  Component Value Date   PSA 1.3 07/17/2016    Lung cancer: N/A   Low Dose CT Chest recommended if Age 40-80 years, 20 pack-year currently smoking OR have quit w/in 15years. Stop screening once a person has not smoked for 15 years or has a health problem that limits life.Patient does not qualify.   AAA: N/A   - The USPSTF recommends one-time screening with ultrasonography in men ages 58  to 75 years who have ever smoked ECG:  No concerning sx's, denies chest pain, palpitations, shortness of breath, increased lower extremity swelling  Advanced Care Planning: A voluntary discussion about advance care planning including the explanation and discussion of advance directives.  Discussed health care proxy and Living will, and the patient was able to identify a health care proxy as [place] Longest  Patient does not have a living will at present time. If patient does have living will, I have requested they bring this to the clinic to be scanned in to their chart.  Patient Active Problem List   Diagnosis Date Noted  . Class 3 severe obesity with serious comorbidity and body mass index (BMI) of 40.0 to 44.9 in adult (HCC) 05/14/2018  . Screening for colon cancer 10/31/2016  . Annual physical exam 07/17/2016  . Prediabetes 04/17/2016  . Need for influenza vaccination 05/02/2015  . OSA (obstructive sleep apnea) 05/02/2015  . Hyperglycemia 05/02/2015  . Essential hypertension 05/02/2015  . Hyperlipidemia 05/02/2015    Past Surgical History:  Procedure Laterality Date  . NO PAST SURGERIES      No family history on file.  Social History   Socioeconomic History  . Marital status: Married    Spouse name: Not on file  . Number of children: Not on file  . Years of education: Not on file  . Highest education level: Not on file  Occupational History  . Not on file  Tobacco Use  . Smoking status: Never Smoker  . Smokeless tobacco: Never Used  Substance and Sexual Activity  . Alcohol use:  No    Alcohol/week: 0.0 standard drinks    Comment: quit 30 years ago  . Drug use: No  . Sexual activity: Yes    Partners: Female  Other Topics Concern  . Not on file  Social History Narrative  . Not on file   Social Determinants of Health   Financial Resource Strain: Not on file  Food Insecurity: Not on file  Transportation Needs: Not on file  Physical Activity: Not on file  Stress: Not on file  Social Connections: Not on file  Intimate Partner Violence: Not on file     Current Outpatient Medications:  .  amLODipine (NORVASC) 5 MG tablet, Take 1 tablet (5 mg total) by mouth daily., Disp: 90 tablet, Rfl: 3 .  aspirin EC 81 MG tablet, Take 81 mg by mouth daily., Disp: , Rfl:  .  atorvastatin (LIPITOR) 40 MG tablet, Take 1 tablet (40 mg total) by mouth daily., Disp: 90 tablet, Rfl: 3 .  mometasone (NASONEX) 50 MCG/ACT nasal spray, Place 2 sprays into the nose daily., Disp: 1 g, Rfl: 0 .  olmesartan-hydrochlorothiazide (BENICAR HCT) 40-25 MG tablet, Take 1 tablet by mouth daily., Disp: 90 tablet, Rfl: 3  No Known Allergies   ROS: As noted above in HPI denies any recent unintentional weight loss, increased fatigue No recent fevers or other Covid concerning sx's, had the Covid vaccine and the booster as well as the influenza vaccine No increase headaches, vision changes No CP, palpitations,  No increased SOB,   No persistent abdominal pains or change in bowel habits,  No rectal bleeding or dark/black stools,  No flank pains or dysuria,  No frequency or urgency No increased lower extremity swelling,  Has intermittent right knee pains, was seen by orthopedics at [facility] clinic with imaging done in late 2020 with osteoarthritis and enthesopathy of the right knee diagnosed and did have a steroid  injection to help manage. No numbness, tingling or weakness in the extremities Denies other specific complaints on systems review except as noted in HPI    Objective  Vitals:    06/09/20 0953  Pulse: 84  Resp: 18  Temp: 98.1 F (36.7 C)  TempSrc: Oral  SpO2: 95%  Weight: 296 lb 4.8 oz (134.4 kg)  Height: 5\' 11"  (1.803 m)    Body mass index is 41.33 kg/m.  NAD, masked, pleasant HEENT - sclera anicteric, PERRL, EOMI, conj - non-inj'ed, mild cerumen in the external auditory canals bilateral, pharynx clear Neck - supple, no adenopathy, a small right submandibular nodularity palpated, nontender, not significantly enlarged, softer to palpation, likely more glandular, no obvious TM, carotids 2+ and = without bruits bilat Car - RRR without m/g/r Pulm- CTA without wheeze or rales Abd - soft, NT, obese, ND, pronounced distention of the umbilicus with a likely small umbilical hernia, nontender palpating this area, BS+, no obvious HSM, no obvious masses Back - no CVA tenderness,  Ext - no LE edema,  GU - no swelling in inguinal/suprapubic region, NT  Normal descended testes bilaterally, no masses palpated, no lesions, no discharge Rectal: not done, prostate exam deferred (without concerning sx's and after discussion on current recommendations for prostate CA screening including PSA tests) Neuro - affect was not flat, appropriate with conversation  Grossly non-focal with good strength on testing, sensation intact to LT in distal extremities, DTR's 2+ and = patella, Romberg neg, no pronator drift, good balance on one foot, good finger to nose, good tandem walk   No results found for this or any previous visit (from the past 2160 hour(s)).   PHQ2/9: Depression screen [facility] 2/9 06/09/2020 01/28/2020 01/08/2020 04/10/2019 03/17/2019  Decreased Interest 0 0 0 0 0  Down, Depressed, Hopeless 0 0 0 0 0  PHQ - 2 Score 0 0 0 0 0  Altered sleeping - 0 0 0 0  Tired, decreased energy - 0 0 0 0  Change in appetite - 0 0 0 0  Feeling bad or failure about yourself  - 0 0 0 0  Trouble concentrating - 0 0 0 0  Moving slowly or fidgety/restless - 0 0 0 0  Suicidal thoughts - 0 0 0 0   PHQ-9 Score - 0 0 0 0  Difficult doing work/chores - Not difficult at all - Not difficult at all Not difficult at all  Some recent data might be hidden     Fall Risk: Fall Risk  06/09/2020 01/28/2020 01/08/2020 04/10/2019 03/17/2019  Falls in the past year? 0 0 0 0 0  Number falls in past yr: 0 0 0 0 0  Injury with Fall? 0 0 0 0 0  Follow up Falls evaluation completed Falls evaluation completed - Falls evaluation completed -      Functional Status Survey: Is the patient deaf or have difficulty hearing?: No Does the patient have difficulty seeing, even when wearing glasses/contacts?: No Does the patient have difficulty concentrating, remembering, or making decisions?: No Does the patient have difficulty walking or climbing stairs?: No Does the patient have difficulty dressing or bathing?: No Does the patient have difficulty doing errands alone such as visiting a doctor's office or shopping?: No    Assessment & Plan  General Health/Health Maintenance:   -Prostate cancer screening and PSA options (with potential risks and benefits of testing vs not testing) were discussed along with recent recs/guidelines. We will continue to follow PSAs over time to  help with screening. Can check when has his next blood draw likely on his next follow-up visit  -USPSTF grade A and B recommendations reviewed with patient; age-appropriate recommendations, preventive care, screening tests, etc discussed and encouraged; healthy living encouraged; see AVS for any further patient education given to patient  -Discussed importance of regular physical activity weekly, strongly encouraged increasing today  -Discussed importance of eating healthy diet, with continued attempts at weight loss encouraged  -Reviewed Health Maintenance:  Adult vaccines due  Topic Date Due  . TETANUS/TDAP  04/08/2028       1. Essential hypertension Blood pressure well controlled on check today Continue with current  medication regimen   2. Hyperlipidemia, unspecified hyperlipidemia type Patient has not really been taking the statin product with any regularity, and noted he did just start retaking it again before this visit. Discussed the statin product and do feel it is a very helpful medicine with improving his lipid panel, and also preventing cardiac disease in the future. Encouraged him to continue taking presently. We will check a lipid panel on a follow-up visit after he has been on this medicine to help assess his lipid status while taking the Lipitor.  3. Prediabetes Continue to follow-up with rechecks of his A1c on follow-up visits.  4. Class 3 severe obesity with serious comorbidity and body mass index (BMI) of 40.0 to 44.9 in adult, unspecified obesity type (HCC) Encourage healthy diet,  increasing exercise to help with weight loss over time.  Noted this is a slow process, with realistic goals in reduction of weights over time and strongly encouraged him to continue with his recent dietary modifications and trying to be more physically active to help Also noted this would help with his knee issues as well.  5. Umbilical hernia Continue to monitor, asymptomatic  6. Right knee osteoarthritis Noted if symptoms again more problematic over time, follow-up with orthopedics.  7. Annual physical exam We will schedule a follow-up with his PCP, Lavada MesiLisa Tapia in approximately 12 weeks, and can check labs at that follow-up visit, follow-up sooner as needed. I did note to him today that follow-up cannot be with me as I will be leaving this practice sooner than that planned follow-up.  Marguerite Olealifford Daniel Jontae Sonier, MD

## 2020-06-09 NOTE — Patient Instructions (Signed)
Preventive Care 41-62 Years Old, Male Preventive care refers to lifestyle choices and visits with your health care provider that can promote health and wellness. This includes:  A yearly physical exam. This is also called an annual well check.  Regular dental and eye exams.  Immunizations.  Screening for certain conditions.  Healthy lifestyle choices, such as eating a healthy diet, getting regular exercise, not using drugs or products that contain nicotine and tobacco, and limiting alcohol use. What can I expect for my preventive care visit? Physical exam Your health care provider will check:  Height and weight. These may be used to calculate body mass index (BMI), which is a measurement that tells if you are at a healthy weight.  Heart rate and blood pressure.  Your skin for abnormal spots. Counseling Your health care provider may ask you questions about:  Alcohol, tobacco, and drug use.  Emotional well-being.  Home and relationship well-being.  Sexual activity.  Eating habits.  Work and work Statistician. What immunizations do I need?  Influenza (flu) vaccine  This is recommended every year. Tetanus, diphtheria, and pertussis (Tdap) vaccine  You may need a Td booster every 10 years. Varicella (chickenpox) vaccine  You may need this vaccine if you have not already been vaccinated. Zoster (shingles) vaccine  You may need this after age 64. Measles, mumps, and rubella (MMR) vaccine  You may need at least one dose of MMR if you were born in 1957 or later. You may also need a second dose. Pneumococcal conjugate (PCV13) vaccine  You may need this if you have certain conditions and were not previously vaccinated. Pneumococcal polysaccharide (PPSV23) vaccine  You may need one or two doses if you smoke cigarettes or if you have certain conditions. Meningococcal conjugate (MenACWY) vaccine  You may need this if you have certain conditions. Hepatitis A  vaccine  You may need this if you have certain conditions or if you travel or work in places where you may be exposed to hepatitis A. Hepatitis B vaccine  You may need this if you have certain conditions or if you travel or work in places where you may be exposed to hepatitis B. Haemophilus influenzae type b (Hib) vaccine  You may need this if you have certain risk factors. Human papillomavirus (HPV) vaccine  If recommended by your health care provider, you may need three doses over 6 months. You may receive vaccines as individual doses or as more than one vaccine together in one shot (combination vaccines). Talk with your health care provider about the risks and benefits of combination vaccines. What tests do I need? Blood tests  Lipid and cholesterol levels. These may be checked every 5 years, or more frequently if you are over 60 years old.  Hepatitis C test.  Hepatitis B test. Screening  Lung cancer screening. You may have this screening every year starting at age 43 if you have a 30-pack-year history of smoking and currently smoke or have quit within the past 15 years.  Prostate cancer screening. Recommendations will vary depending on your family history and other risks.  Colorectal cancer screening. All adults should have this screening starting at age 72 and continuing until age 2. Your health care provider may recommend screening at age 14 if you are at increased risk. You will have tests every 1-10 years, depending on your results and the type of screening test.  Diabetes screening. This is done by checking your blood sugar (glucose) after you have not eaten  for a while (fasting). You may have this done every 1-3 years.  Sexually transmitted disease (STD) testing. Follow these instructions at home: Eating and drinking  Eat a diet that includes fresh fruits and vegetables, whole grains, lean protein, and low-fat dairy products.  Take vitamin and mineral supplements as  recommended by your health care provider.  Do not drink alcohol if your health care provider tells you not to drink.  If you drink alcohol: ? Limit how much you have to 0-2 drinks a day. ? Be aware of how much alcohol is in your drink. In the [place] one drink equals one 12 oz bottle of beer (355 mL), one 5 oz glass of wine (148 mL), or one 1 oz glass of hard liquor (44 mL). Lifestyle  Take daily care of your teeth and gums.  Stay active. Exercise for at least 30 minutes on 5 or more days each week.  Do not use any products that contain nicotine or tobacco, such as cigarettes, e-cigarettes, and chewing tobacco. If you need help quitting, ask your health care provider.  If you are sexually active, practice safe sex. Use a condom or other form of protection to prevent STIs (sexually transmitted infections).  Talk with your health care provider about taking a low-dose aspirin every day starting at age 53. What's next?  Go to your health care provider once a year for a well check visit.  Ask your health care provider how often you should have your eyes and teeth checked.  Stay up to date on all vaccines. This information is not intended to replace advice given to you by your health care provider. Make sure you discuss any questions you have with your health care provider. Document Revised: 06/05/2018 Document Reviewed: 06/05/2018 Elsevier Patient Education  2020 [employer].

## 2020-08-01 ENCOUNTER — Ambulatory Visit: Payer: BC Managed Care – PPO | Admitting: Family Medicine

## 2020-08-02 ENCOUNTER — Ambulatory Visit: Payer: BC Managed Care – PPO | Admitting: Family Medicine

## 2020-08-23 ENCOUNTER — Ambulatory Visit: Payer: Self-pay | Admitting: Family Medicine

## 2020-09-15 ENCOUNTER — Other Ambulatory Visit: Payer: Self-pay | Admitting: Family Medicine

## 2020-09-15 DIAGNOSIS — I10 Essential (primary) hypertension: Secondary | ICD-10-CM

## 2020-09-15 DIAGNOSIS — Z5181 Encounter for therapeutic drug level monitoring: Secondary | ICD-10-CM

## 2020-09-15 MED ORDER — AMLODIPINE BESYLATE 5 MG PO TABS: 5.0000 mg | ORAL_TABLET | Freq: Every day | ORAL | 0 refills | Status: DC

## 2020-09-15 MED ORDER — OLMESARTAN MEDOXOMIL-HCTZ 40-25 MG PO TABS: 1.0000 | ORAL_TABLET | Freq: Every day | ORAL | 0 refills | Status: DC

## 2020-09-15 NOTE — Telephone Encounter (Signed)
Medication Refill - Medication: olmesartan-hydrochlorothiazide (BENICAR HCT) 40-25 MG tablet amLODipine (NORVASC) 5 MG tablet   Almost out of both  Scheduled for next available with PCP   Has the patient contacted their pharmacy? No. (Agent: If no, request that the patient contact the pharmacy for the refill.) (Agent: If yes, when and what did the pharmacy advise?)  Preferred Pharmacy (with phone number or street name):  [id number] - [place], [place] - [address]  [address] [place] [address]  Phone: [phone] Fax: [phone]     Agent: Please be advised that RX refills may take up to 3 business days. We ask that you follow-up with your pharmacy.

## 2020-10-25 ENCOUNTER — Other Ambulatory Visit: Payer: Self-pay

## 2020-10-25 ENCOUNTER — Encounter: Payer: Self-pay | Admitting: Family Medicine

## 2020-10-25 ENCOUNTER — Ambulatory Visit: Payer: BC Managed Care – PPO | Admitting: Family Medicine

## 2020-10-25 VITALS — BP 122/70 | HR 74 | Temp 98.0°F | Resp 14 | Ht 71.0 in | Wt 282.5 lb

## 2020-10-25 DIAGNOSIS — Z114 Encounter for screening for human immunodeficiency virus [HIV]: Secondary | ICD-10-CM

## 2020-10-25 DIAGNOSIS — R7303 Prediabetes: Secondary | ICD-10-CM | POA: Diagnosis not present

## 2020-10-25 DIAGNOSIS — Z5181 Encounter for therapeutic drug level monitoring: Secondary | ICD-10-CM

## 2020-10-25 DIAGNOSIS — Z1211 Encounter for screening for malignant neoplasm of colon: Secondary | ICD-10-CM

## 2020-10-25 DIAGNOSIS — I10 Essential (primary) hypertension: Secondary | ICD-10-CM | POA: Diagnosis not present

## 2020-10-25 DIAGNOSIS — E785 Hyperlipidemia, unspecified: Secondary | ICD-10-CM

## 2020-10-25 MED ORDER — OLMESARTAN MEDOXOMIL-HCTZ 40-25 MG PO TABS: 1.0000 | ORAL_TABLET | Freq: Every day | ORAL | 3 refills | Status: DC

## 2020-10-25 MED ORDER — OMEPRAZOLE 20 MG PO CPDR: 20.0000 mg | DELAYED_RELEASE_CAPSULE | Freq: Every day | ORAL | 3 refills | Status: DC | PRN

## 2020-10-25 MED ORDER — AMLODIPINE BESYLATE 5 MG PO TABS: 5.0000 mg | ORAL_TABLET | Freq: Every day | ORAL | 3 refills | Status: DC

## 2020-10-25 NOTE — Progress Notes (Signed)
Name: Tristan Miller   MRN: [id number]    DOB: 1955-12-17   Date:10/25/2020       Progress Note  Chief Complaint  Patient presents with  . Hypertension  . Hyperlipidemia     Subjective:   Tristan Miller is a 65 y.o. male, presents to clinic for routine follow-up on hypertension and hyperlipidemia  Hypertension:  Currently managed on amlodipine 5 mg daily benicar 40  Pt reports good med compliance and denies any SE.   Blood pressure today is well controlled. BP Readings from Last 3 Encounters:  10/25/20 122/70  06/09/20 128/76  01/28/20 122/76   Pt denies CP, SOB, exertional sx, LE edema, palpitation, Ha's, visual disturbances, lightheadedness, hypotension, syncope.   Hyperlipidemia: Currently treated with Lipitor 40 mg daily, pt reports good med compliance Last Lipids: Lab Results  Component Value Date   CHOL 182 01/28/2020   HDL 40 01/28/2020   LDLCALC 123 (H) 01/28/2020   TRIG 89 01/28/2020   CHOLHDL 4.6 01/28/2020   - Denies: Chest pain, shortness of breath, myalgias, claudication  GERD: Using omeprazole, easily triggered by food, denies any severe abdominal pain, cramping, change in bowels, melena, hematochezia, dysphagia  ASA - calculator      Current Outpatient Medications:  .  amLODipine (NORVASC) 5 MG tablet, Take 1 tablet (5 mg total) by mouth daily., Disp: 90 tablet, Rfl: 0 .  aspirin EC 81 MG tablet, Take 81 mg by mouth daily., Disp: , Rfl:  .  atorvastatin (LIPITOR) 40 MG tablet, Take 1 tablet (40 mg total) by mouth daily., Disp: 90 tablet, Rfl: 3 .  mometasone (NASONEX) 50 MCG/ACT nasal spray, Place 2 sprays into the nose daily., Disp: 1 g, Rfl: 0 .  olmesartan-hydrochlorothiazide (BENICAR HCT) 40-25 MG tablet, Take 1 tablet by mouth daily., Disp: 90 tablet, Rfl: 0  Patient Active Problem List   Diagnosis Date Noted  . Umbilical hernia without obstruction and without gangrene 06/09/2020  . Osteoarthritis of right knee 06/09/2020  . Class 3  severe obesity with serious comorbidity and body mass index (BMI) of 40.0 to 44.9 in adult (HCC) 05/14/2018  . Screening for colon cancer 10/31/2016  . Annual physical exam 07/17/2016  . Prediabetes 04/17/2016  . Need for influenza vaccination 05/02/2015  . OSA (obstructive sleep apnea) 05/02/2015  . Hyperglycemia 05/02/2015  . Essential hypertension 05/02/2015  . Hyperlipidemia 05/02/2015    Past Surgical History:  Procedure Laterality Date  . NO PAST SURGERIES      History reviewed. No pertinent family history.  Social History   Tobacco Use  . Smoking status: Never Smoker  . Smokeless tobacco: Never Used  Vaping Use  . Vaping Use: Never used  Substance Use Topics  . Alcohol use: No    Alcohol/week: 0.0 standard drinks    Comment: quit 30 years ago  . Drug use: No     No Known Allergies  Health Maintenance  Topic Date Due  . Fecal DNA (Cologuard)  Never done  . INFLUENZA VACCINE  01/23/2021  . TETANUS/TDAP  04/08/2028  . COVID-19 Vaccine  Completed  . Hepatitis C Screening  Completed  . HIV Screening  Completed  . HPV VACCINES  Aged Out    Chart Review Today: I personally reviewed active problem list, medication list, allergies, family history, social history, health maintenance, notes from last encounter, lab results, imaging with the patient/caregiver today.   Review of Systems  Constitutional: Negative.   HENT: Negative.   Eyes: Negative.  Respiratory: Negative.   Cardiovascular: Negative.   Gastrointestinal: Negative.   Endocrine: Negative.   Genitourinary: Negative.   Musculoskeletal: Negative.   Skin: Negative.   Allergic/Immunologic: Negative.   Neurological: Negative.   Hematological: Negative.   Psychiatric/Behavioral: Negative.   All other systems reviewed and are negative.    Objective:   Vitals:   10/25/20 1048  BP: 122/70  Pulse: 74  Resp: 14  Temp: 98 F (36.7 C)  SpO2: 96%  Weight: 282 lb 8 oz (128.1 kg)  Height: 5\' 11"   (1.803 m)    Body mass index is 39.4 kg/m.  Physical Exam Vitals and nursing note reviewed.  Constitutional:      General: He is not in acute distress.    Appearance: Normal appearance. He is well-developed. He is obese. He is not ill-appearing, toxic-appearing or diaphoretic.     Interventions: Face mask in place.  HENT:     Head: Normocephalic and atraumatic.     Jaw: No trismus.     Right Ear: External ear normal.     Left Ear: External ear normal.  Eyes:     General: Lids are normal. No scleral icterus.       Right eye: No discharge.        Left eye: No discharge.     Conjunctiva/sclera: Conjunctivae normal.  Neck:     Trachea: Trachea and phonation normal. No tracheal deviation.  Cardiovascular:     Rate and Rhythm: Normal rate and regular rhythm.     Pulses: Normal pulses.          Radial pulses are 2+ on the right side and 2+ on the left side.       Posterior tibial pulses are 2+ on the right side and 2+ on the left side.     Heart sounds: Normal heart sounds. No murmur heard. No friction rub. No gallop.   Pulmonary:     Effort: Pulmonary effort is normal. No respiratory distress.     Breath sounds: Normal breath sounds. No stridor. No wheezing, rhonchi or rales.  Abdominal:     General: Bowel sounds are normal. There is no distension.     Palpations: Abdomen is soft.     Tenderness: There is no abdominal tenderness. There is no right CVA tenderness, left CVA tenderness, guarding or rebound.  Musculoskeletal:     Right lower leg: No edema.     Left lower leg: No edema.  Skin:    General: Skin is warm and dry.     Coloration: Skin is not jaundiced.     Findings: No rash.     Nails: There is no clubbing.  Neurological:     Mental Status: He is alert. Mental status is at baseline.     Cranial Nerves: No dysarthria or facial asymmetry.     Motor: No tremor or abnormal muscle tone.     Gait: Gait normal.  Psychiatric:        Mood and Affect: Mood normal.         Speech: Speech normal.        Behavior: Behavior normal. Behavior is cooperative.         Assessment & Plan:     ICD-10-CM   1. Essential hypertension  I10 amLODipine (NORVASC) 5 MG tablet    olmesartan-hydrochlorothiazide (BENICAR HCT) 40-25 MG tablet    COMPLETE METABOLIC PANEL WITH GFR   Stable, well-controlled, blood pressure at goal today, encouraged low-salt diet/DASH, some increase  activity or weight loss may also help heart health  2. Hyperlipidemia, unspecified hyperlipidemia type  E78.5 COMPLETE METABOLIC PANEL WITH GFR    Lipid panel   Good statin compliance, no myalgias side effects or concerns  3. Prediabetes  R73.03 COMPLETE METABOLIC PANEL WITH GFR    Hemoglobin A1c   Recheck diabetes with increase in weight  4. Screening for HIV without presence of risk factors  Z11.4 HIV antibody (with reflex)  5. Encounter for medication monitoring  Z51.81 amLODipine (NORVASC) 5 MG tablet    olmesartan-hydrochlorothiazide (BENICAR HCT) 40-25 MG tablet    COMPLETE METABOLIC PANEL WITH GFR    Lipid panel    CBC with Differential/Platelet    Hemoglobin A1c  6. Screening for colon cancer  Z12.11 Cologuard       Return in about 6 months (around 04/27/2021) for Routine follow-up.   [name], PA-C 10/25/20 11:19 AM

## 2020-10-25 NOTE — Patient Instructions (Signed)
Try omeprazole for a few weeks and then see if you can wean off of it and you can use Pepcid 20 mg twice a day    Food Choices for Gastroesophageal Reflux Disease, Adult When you have gastroesophageal reflux disease (GERD), the foods you eat and your eating habits are very important. Choosing the right foods can help ease your discomfort. Think about working with a food expert (dietitian) to help you make good choices. What are tips for following this plan? Reading food labels  Look for foods that are low in saturated fat. Foods that may help with your symptoms include: ? Foods that have less than 5% of daily value (DV) of fat. ? Foods that have 0 grams of trans fat. Cooking  Do not fry your food.  Cook your food by baking, steaming, grilling, or broiling. These are all methods that do not need a lot of fat for cooking.  To add flavor, try to use herbs that are low in spice and acidity. Meal planning  Choose healthy foods that are low in fat, such as: ? Fruits and vegetables. ? Whole grains. ? Low-fat dairy products. ? Lean meats, fish, and poultry.  Eat small meals often instead of eating 3 large meals each day. Eat your meals slowly in a place where you are relaxed. Avoid bending over or lying down until 2-3 hours after eating.  Limit high-fat foods such as fatty meats or fried foods.  Limit your intake of fatty foods, such as oils, butter, and shortening.  Avoid the following as told by your doctor: ? Foods that cause symptoms. These may be different for different people. Keep a food diary to keep track of foods that cause symptoms. ? Alcohol. ? Drinking a lot of liquid with meals. ? Eating meals during the 2-3 hours before bed.   Lifestyle  Stay at a healthy weight. Ask your doctor what weight is healthy for you. If you need to lose weight, work with your doctor to do so safely.  Exercise for at least 30 minutes on 5 or more days each week, or as told by your  doctor.  Wear loose-fitting clothes.  Do not smoke or use any products that contain nicotine or tobacco. If you need help quitting, ask your doctor.  Sleep with the head of your bed higher than your feet. Use a wedge under the mattress or blocks under the bed frame to raise the head of the bed.  Chew sugar-free gum after meals. What foods should eat? Eat a healthy, well-balanced diet of fruits, vegetables, whole grains, low-fat dairy products, lean meats, fish, and poultry. Each person is different. Foods that may cause symptoms in one person may not cause any symptoms in another person. Work with your doctor to find foods that are safe for you. The items listed above may not be a complete list of what you can eat and drink. Contact a food expert for more options.   What foods should I avoid? Limiting some of these foods may help in managing the symptoms of GERD. Everyone is different. Talk with a food expert or your doctor to help you find the exact foods to avoid, if any. Fruits Any fruits prepared with added fat. Any fruits that cause symptoms. For some people, this may include citrus fruits, such as oranges, grapefruit, pineapple, and lemons. Vegetables Deep-fried vegetables. [place] fries. Any vegetables prepared with added fat. Any vegetables that cause symptoms. For some people, this may include tomatoes and  tomato products, chili peppers, onions and garlic, and horseradish. Grains Pastries or quick breads with added fat. Meats and other proteins High-fat meats, such as fatty beef or pork, hot dogs, ribs, ham, sausage, salami, and bacon. Fried meat or protein, including fried fish and fried chicken. Nuts and nut butters, in large amounts. Dairy Whole milk and chocolate milk. Sour cream. Cream. Ice cream. Cream cheese. Milkshakes. Fats and oils Butter. Margarine. Shortening. Ghee. Beverages Coffee and tea, with or without caffeine. Carbonated beverages. Sodas. Energy drinks. Fruit  juice made with acidic fruits, such as orange or grapefruit. Tomato juice. Alcoholic drinks. Sweets and desserts Chocolate and cocoa. Donuts. Seasonings and condiments Pepper. Peppermint and spearmint. Added salt. Any condiments, herbs, or seasonings that cause symptoms. For some people, this may include curry, hot sauce, or vinegar-based salad dressings. The items listed above may not be a complete list of what you should not eat and drink. Contact a food expert for more options. Questions to ask your doctor Diet and lifestyle changes are often the first steps that are taken to manage symptoms of GERD. If diet and lifestyle changes do not help, talk with your doctor about taking medicines. Where to find more information  International Foundation for Gastrointestinal Disorders: aboutgerd.org Summary  When you have GERD, food and lifestyle choices are very important in easing your symptoms.  Eat small meals often instead of 3 large meals a day. Eat your meals slowly and in a place where you are relaxed.  Avoid bending over or lying down until 2-3 hours after eating.  Limit high-fat foods such as fatty meats or fried foods. This information is not intended to replace advice given to you by your health care provider. Make sure you discuss any questions you have with your health care provider. Document Revised: 12/21/2019 Document Reviewed: 12/21/2019 Elsevier Patient Education  2021 [employer].

## 2020-10-26 LAB — COMPLETE METABOLIC PANEL WITH GFR
AG Ratio: 1.5 (calc) (ref 1.0–2.5)
ALT: 21 U/L (ref 9–46)
AST: 19 U/L (ref 10–35)
Albumin: 3.8 g/dL (ref 3.6–5.1)
Alkaline phosphatase (APISO): 62 U/L (ref 35–144)
BUN: 15 mg/dL (ref 7–25)
CO2: 29 mmol/L (ref 20–32)
Calcium: 9.3 mg/dL (ref 8.6–10.3)
Chloride: 103 mmol/L (ref 98–110)
Creat: 1.06 mg/dL (ref 0.70–1.25)
GFR, Est African American: 86 mL/min/{1.73_m2} (ref >=60)
GFR, Est Non African American: 74 mL/min/{1.73_m2} (ref >=60)
Globulin: 2.5 g/dL (calc) (ref 1.9–3.7)
Glucose, Bld: 96 mg/dL (ref 65–99)
Potassium: 3.5 mmol/L (ref 3.5–5.3)
Sodium: 140 mmol/L (ref 135–146)
Total Bilirubin: 0.4 mg/dL (ref 0.2–1.2)
Total Protein: 6.3 g/dL (ref 6.1–8.1)

## 2020-10-26 LAB — HIV ANTIBODY (ROUTINE TESTING W REFLEX): HIV 1&2 Ab, 4th Generation: NONREACTIVE

## 2020-10-26 LAB — CBC WITH DIFFERENTIAL/PLATELET
Absolute Monocytes: 702 cells/uL (ref 200–950)
Basophils Absolute: 31 cells/uL (ref 0–200)
Basophils Relative: 0.6 %
Eosinophils Absolute: 42 cells/uL (ref 15–500)
Eosinophils Relative: 0.8 %
HCT: 42.3 % (ref 38.5–50.0)
Hemoglobin: 13.6 g/dL (ref 13.2–17.1)
Lymphs Abs: 1934 cells/uL (ref 850–3900)
MCH: 26.3 pg — ABNORMAL LOW (ref 27.0–33.0)
MCHC: 32.2 g/dL (ref 32.0–36.0)
MCV: 81.8 fL (ref 80.0–100.0)
MPV: 11.5 fL (ref 7.5–12.5)
Monocytes Relative: 13.5 %
Neutro Abs: 2491 cells/uL (ref 1500–7800)
Neutrophils Relative %: 47.9 %
Platelets: 212 10*3/uL (ref 140–400)
RBC: 5.17 10*6/uL (ref 4.20–5.80)
RDW: 13.6 % (ref 11.0–15.0)
Total Lymphocyte: 37.2 %
WBC: 5.2 10*3/uL (ref 3.8–10.8)

## 2020-10-26 LAB — LIPID PANEL
Cholesterol: 194 mg/dL (ref <200)
HDL: 44 mg/dL (ref >=40)
LDL Cholesterol (Calc): 131 mg/dL (calc) — ABNORMAL HIGH
Non-HDL Cholesterol (Calc): 150 mg/dL (calc) — ABNORMAL HIGH (ref <130)
Total CHOL/HDL Ratio: 4.4 (calc) (ref <5.0)
Triglycerides: 88 mg/dL (ref <150)

## 2020-10-26 LAB — HEMOGLOBIN A1C
Hgb A1c MFr Bld: 6 % of total Hgb — ABNORMAL HIGH (ref <5.7)
Mean Plasma Glucose: 126 mg/dL
eAG (mmol/L): 7 mmol/L

## 2020-11-10 ENCOUNTER — Ambulatory Visit: Payer: Self-pay

## 2020-11-10 NOTE — Telephone Encounter (Signed)
Patient called and says he went to the UC on 11/06/20 for a cough that started on 11/04/20. He says the COVID test was negative. He says he had sinus issues over the weekend as well, but now the sinus has cleared up and the cough is still there. He says the cough wakes him up at night and during the day it's fine. He says first thing in the morning he coughs up what looks like a dark colored phlegm, greenish in color. He says as the day goes on the phlegm is clear. He says the UC provider gave him benzonatate pills and promethazine DM syrup for cough. He says he doesn't think they are working for him and asks if there is something else to take. He says he has mucinex and will take that as well. He denies SOB, CP. I advised he will need a virtual visit for any advice from provider. No availability with PCP or any provider in the office. I advised home care advice and that this note will be sent to PCP for any advice she has, someone from the office will call back. Patient verbalized understanding.   Pt called stating that he was seen in UC on 11/06/20. He states that he was given a cough medication, benzonatate, and is not sure if this the best thing for him to take. He is requesting to have advice on if there is anything better he should be taking. Pt states that he believes this medication is working very slow and is looking for a possible alternative. Pt states that his cough is productive in the mornings, but keeping him up at night. Please advise.     Reason for Disposition . Cough  Answer Assessment - Initial Assessment Questions 1. ONSET: "When did the cough begin?"      11/04/20 2. SEVERITY: "How bad is the cough today?"      Better, not too bad in the day, but it wakes me up at night coughing 3. SPUTUM: "Describe the color of your sputum" (none, dry cough; clear, white, yellow, green)     In the morning greenish look, then clear as the day goes on 4. HEMOPTYSIS: "Are you coughing up any blood?"  If so ask: "How much?" (flecks, streaks, tablespoons, etc.)     No 5. DIFFICULTY BREATHING: "Are you having difficulty breathing?" If Yes, ask: "How bad is it?" (e.g., mild, moderate, severe)    - MILD: No SOB at rest, mild SOB with walking, speaks normally in sentences, can lie down, no retractions, pulse < 100.    - MODERATE: SOB at rest, SOB with minimal exertion and prefers to sit, cannot lie down flat, speaks in phrases, mild retractions, audible wheezing, pulse 100-120.    - SEVERE: Very SOB at rest, speaks in single words, struggling to breathe, sitting hunched forward, retractions, pulse > 120      No 6. FEVER: "Do you have a fever?" If Yes, ask: "What is your temperature, how was it measured, and when did it start?"     No 7. CARDIAC HISTORY: "Do you have any history of heart disease?" (e.g., heart attack, congestive heart failure)      No 8. LUNG HISTORY: "Do you have any history of lung disease?"  (e.g., pulmonary embolus, asthma, emphysema)     No 9. PE RISK FACTORS: "Do you have a history of blood clots?" (or: recent major surgery, recent prolonged travel, bedridden)     No 10. OTHER SYMPTOMS: "Do you  have any other symptoms?" (e.g., runny nose, wheezing, chest pain)       No  11. PREGNANCY: "Is there any chance you are pregnant?" "When was your last menstrual period?"       N/A 12. TRAVEL: "Have you traveled out of the country in the last month?" (e.g., travel history, exposures)       No  Protocols used: COUGH - ACUTE PRODUCTIVE-A-AH

## 2020-11-11 NOTE — Telephone Encounter (Signed)
FYI

## 2020-12-01 ENCOUNTER — Encounter: Payer: Self-pay | Admitting: Unknown Physician Specialty

## 2020-12-01 ENCOUNTER — Telehealth (INDEPENDENT_AMBULATORY_CARE_PROVIDER_SITE_OTHER): Payer: BC Managed Care – PPO | Admitting: Unknown Physician Specialty

## 2020-12-01 ENCOUNTER — Other Ambulatory Visit: Payer: Self-pay

## 2020-12-01 VITALS — Ht 71.0 in | Wt 279.0 lb

## 2020-12-01 DIAGNOSIS — R059 Cough, unspecified: Secondary | ICD-10-CM

## 2020-12-01 DIAGNOSIS — U071 COVID-19: Secondary | ICD-10-CM | POA: Diagnosis not present

## 2020-12-01 MED ORDER — BENZONATATE 200 MG PO CAPS
200.0000 mg | ORAL_CAPSULE | Freq: Two times a day (BID) | ORAL | 0 refills | Status: DC | PRN
  Filled 2020-12-01: qty 20, 10d supply, fill #0

## 2020-12-01 MED ORDER — NIRMATRELVIR/RITONAVIR (PAXLOVID)TABLET
3.0000 | ORAL_TABLET | Freq: Two times a day (BID) | ORAL | 0 refills | Status: AC
  Filled 2020-12-01: qty 30, 5d supply, fill #0

## 2020-12-01 NOTE — Progress Notes (Addendum)
Acute Office Visit  Subjective:    Patient ID: Tristan Miller, male    DOB: 1956/03/11, 65 y.o.   MRN: [id number]  Chief Complaint  Patient presents with   Covid Positive    Cough and nasal congestion x3 days took home test and positive   This visit was completed via telephone due to the restrictions of the COVID-19 pandemic. All issues as above were discussed and addressed but no physical exam was performed. If it was felt that the patient should be evaluated in the office, they were directed there. The patient verbally consented to this visit. Patient was unable to complete an audio/visual visit due to technical issues Location of the patient: home Location of the provider: work Those involved with this call:  Provider: Gabriel Cirri FNP CMA: Richmond Campbell and Myriam Jacobson Time spent on call: 15 minutes with 5 minute chart review I verified patient identity using two factors (patient name and date of birth). Patient consents verbally to being seen via telemedicine visit today.   URI  This is a new (sx onset 4 days ago) problem. The problem has been gradually improving. There has been no fever (Fever is high). Associated symptoms include congestion, coughing, diarrhea, headaches, rhinorrhea and sinus pain. Pertinent negatives include no abdominal pain, chest pain, dysuria, joint pain, joint swelling, nausea or vomiting. He has tried nothing for the symptoms.    Past Medical History:  Diagnosis Date   Hyperlipidemia    Hypertension     Past Surgical History:  Procedure Laterality Date   NO PAST SURGERIES      History reviewed. No pertinent family history.  Social History   Socioeconomic History   Marital status: Married    Spouse name: Not on file   Number of children: Not on file   Years of education: Not on file   Highest education level: Not on file  Occupational History   Not on file  Tobacco Use   Smoking status: Never   Smokeless tobacco: Never  Vaping Use   Vaping Use:  Never used  Substance and Sexual Activity   Alcohol use: No    Alcohol/week: 0.0 standard drinks    Comment: quit 30 years ago   Drug use: No   Sexual activity: Yes    Partners: Female  Other Topics Concern   Not on file  Social History Narrative   Not on file   Social Determinants of Health   Financial Resource Strain: Low Risk    Difficulty of Paying Living Expenses: Not hard at all  Food Insecurity: No Food Insecurity   Worried About Programme researcher, broadcasting/film/video in the Last Year: Never true   Ran Out of Food in the Last Year: Never true  Transportation Needs: No Transportation Needs   Lack of Transportation (Medical): No   Lack of Transportation (Non-Medical): No  Physical Activity: Insufficiently Active   Days of Exercise per Week: 2 days   Minutes of Exercise per Session: 20 min  Stress: No Stress Concern Present   Feeling of Stress : Only a little  Social Connections: Moderately Integrated   Frequency of Communication with Friends and Family: Once a week   Frequency of Social Gatherings with Friends and Family: Once a week   Attends Religious Services: 1 to 4 times per year   Active Member of [employer] or Organizations: Yes   Attends Engineer, structural: More than 4 times per year   Marital Status: Married  Catering manager Violence:  Not At Risk   Fear of Current or Ex-Partner: No   Emotionally Abused: No   Physically Abused: No   Sexually Abused: No    Outpatient Medications Prior to Visit  Medication Sig Dispense Refill   amLODipine (NORVASC) 5 MG tablet Take 1 tablet (5 mg total) by mouth daily. 90 tablet 3   atorvastatin (LIPITOR) 40 MG tablet Take 1 tablet (40 mg total) by mouth daily. 90 tablet 3   mometasone (NASONEX) 50 MCG/ACT nasal spray Place 2 sprays into the nose daily. 1 g 0   olmesartan-hydrochlorothiazide (BENICAR HCT) 40-25 MG tablet Take 1 tablet by mouth daily. 90 tablet 3   omeprazole (PRILOSEC) 20 MG capsule Take 1 capsule (20 mg total) by mouth  daily as needed. 30 capsule 3   No facility-administered medications prior to visit.    No Known Allergies  Review of Systems  HENT:  Positive for congestion, rhinorrhea and sinus pain.   Respiratory:  Positive for cough.   Cardiovascular:  Negative for chest pain.  Gastrointestinal:  Positive for diarrhea. Negative for abdominal pain, nausea and vomiting.  Genitourinary:  Negative for dysuria.  Musculoskeletal:  Negative for joint pain.  Neurological:  Positive for headaches.      Objective:    Physical Exam Neurological:     Mental Status: He is alert and oriented to person, place, and time.  Psychiatric:        Mood and Affect: Mood normal.   GFR 1 month ago >60  Ht 5\' 11"  (1.803 m)   Wt 279 lb (126.6 kg)   BMI 38.91 kg/m  Wt Readings from Last 3 Encounters:  12/01/20 279 lb (126.6 kg)  10/25/20 282 lb 8 oz (128.1 kg)  06/09/20 296 lb 4.8 oz (134.4 kg)    Health Maintenance Due  Topic Date Due   Fecal DNA (Cologuard)  Never done   Zoster Vaccines- Shingrix (1 of 2) Never done   COVID-19 Vaccine (4 - Booster for Pfizer series) 08/15/2020    There are no preventive care reminders to display for this patient.   Lab Results  Component Value Date   TSH 1.89 07/17/2016   Lab Results  Component Value Date   WBC 5.2 10/25/2020   HGB 13.6 10/25/2020   HCT 42.3 10/25/2020   MCV 81.8 10/25/2020   PLT 212 10/25/2020   Lab Results  Component Value Date   NA 140 10/25/2020   K 3.5 10/25/2020   CO2 29 10/25/2020   GLUCOSE 96 10/25/2020   BUN 15 10/25/2020   CREATININE 1.06 10/25/2020   BILITOT 0.4 10/25/2020   ALKPHOS 66 10/31/2016   AST 19 10/25/2020   ALT 21 10/25/2020   PROT 6.3 10/25/2020   ALBUMIN 4.0 10/31/2016   CALCIUM 9.3 10/25/2020   Lab Results  Component Value Date   CHOL 194 10/25/2020   Lab Results  Component Value Date   HDL 44 10/25/2020   Lab Results  Component Value Date   LDLCALC 131 (H) 10/25/2020   Lab Results  Component  Value Date   TRIG 88 10/25/2020   Lab Results  Component Value Date   CHOLHDL 4.4 10/25/2020   Lab Results  Component Value Date   HGBA1C 6.0 (H) 10/25/2020       Assessment & Plan:   Problem List Items Addressed This Visit   None Visit Diagnoses     COVID-19    -  Primary   Rx for Paxlovid. Stop Atorvastatin during the  Paxlovid.  Monitor BP with Amlodipine interaction.  Pt ed on Paxlovid   Relevant Medications   nirmatrelvir/ritonavir EUA (PAXLOVID) TABS   Cough       Rx for Tessalon Perles.  Pt ed on cough and quarantine.          Meds ordered this encounter  Medications   nirmatrelvir/ritonavir EUA (PAXLOVID) TABS    Sig: Take 3 tablets by mouth 2 (two) times daily for 5 days. (Take nirmatrelvir 150 mg two tablets twice daily for 5 days and ritonavir 100 mg one tablet twice daily for 5 days) Patient GFR is >60    Dispense:  30 tablet    Refill:  0     Gabriel Cirri, NP

## 2020-12-26 ENCOUNTER — Other Ambulatory Visit: Payer: Self-pay | Admitting: Family Medicine

## 2021-04-20 ENCOUNTER — Other Ambulatory Visit: Payer: Self-pay | Admitting: Family Medicine

## 2021-04-23 ENCOUNTER — Other Ambulatory Visit: Payer: Self-pay | Admitting: Family Medicine

## 2021-04-23 DIAGNOSIS — E785 Hyperlipidemia, unspecified: Secondary | ICD-10-CM

## 2021-04-23 DIAGNOSIS — Z5181 Encounter for therapeutic drug level monitoring: Secondary | ICD-10-CM

## 2021-04-23 NOTE — Telephone Encounter (Signed)
Requested Prescriptions  Pending Prescriptions Disp Refills  . atorvastatin (LIPITOR) 40 MG tablet [Pharmacy Med Name: ATORVASTATIN 40 MG TABLET] 90 tablet 2    Sig: TAKE 1 TABLET BY MOUTH EVERY DAY     Cardiovascular:  Antilipid - Statins Failed - 04/23/2021  1:58 PM      Failed - LDL in normal range and within 360 days    LDL Cholesterol (Calc)  Date Value Ref Range Status  10/25/2020 131 (H) mg/dL (calc) Final    Comment:    Reference range: <100 . Desirable range <100 mg/dL for primary prevention;   <70 mg/dL for patients with CHD or diabetic patients  with > or = 2 CHD risk factors. Marland Kitchen LDL-C is now calculated using the Martin-Hopkins  calculation, which is a validated novel method providing  better accuracy than the Friedewald equation in the  estimation of LDL-C.  Horald Pollen et al. Lenox Ahr. 1856;314(97): 2061-2068  (http://education.[facility].com/faq/FAQ164)          Passed - Total Cholesterol in normal range and within 360 days    Cholesterol, Total  Date Value Ref Range Status  05/13/2015 211 (H) 100 - 199 mg/dL Final   Cholesterol  Date Value Ref Range Status  10/25/2020 194 <200 mg/dL Final         Passed - HDL in normal range and within 360 days    HDL  Date Value Ref Range Status  10/25/2020 44 > OR = 40 mg/dL Final  02/63/7858 45 >85 mg/dL Final         Passed - Triglycerides in normal range and within 360 days    Triglycerides  Date Value Ref Range Status  10/25/2020 88 <150 mg/dL Final         Passed - Patient is not pregnant      Passed - Valid encounter within last 12 months    Recent Outpatient Visits          4 months ago COVID-19   [facility] Gabriel Cirri, NP   6 months ago Essential hypertension   [facility] [name], PA-C   10 months ago Annual physical exam   [facility] Jamelle Haring, MD   1 year ago Essential hypertension   [facility] [name], PA-C   1 year ago Essential hypertension   [facility] [name], PA-C      Future Appointments            In 4 days [name], PA-C [facility]

## 2021-04-26 ENCOUNTER — Encounter: Payer: Self-pay | Admitting: Family Medicine

## 2021-04-26 ENCOUNTER — Other Ambulatory Visit: Payer: Self-pay

## 2021-04-26 ENCOUNTER — Ambulatory Visit: Payer: BC Managed Care – PPO | Admitting: Family Medicine

## 2021-04-26 VITALS — BP 146/76 | HR 91 | Temp 97.6°F | Resp 16 | Ht 71.0 in | Wt 277.3 lb

## 2021-04-26 DIAGNOSIS — I10 Essential (primary) hypertension: Secondary | ICD-10-CM

## 2021-04-26 DIAGNOSIS — G4733 Obstructive sleep apnea (adult) (pediatric): Secondary | ICD-10-CM

## 2021-04-26 DIAGNOSIS — E785 Hyperlipidemia, unspecified: Secondary | ICD-10-CM

## 2021-04-26 DIAGNOSIS — J309 Allergic rhinitis, unspecified: Secondary | ICD-10-CM | POA: Diagnosis not present

## 2021-04-26 DIAGNOSIS — Z6841 Body Mass Index (BMI) 40.0 and over, adult: Secondary | ICD-10-CM

## 2021-04-26 DIAGNOSIS — R7303 Prediabetes: Secondary | ICD-10-CM

## 2021-04-26 MED ORDER — MOMETASONE FUROATE 50 MCG/ACT NA SUSP: 2.0000 | Freq: Every day | NASAL | 0 refills | Status: DC

## 2021-04-26 NOTE — Assessment & Plan Note (Signed)
Recommend consistency with medication. Recheck LDL today.

## 2021-04-26 NOTE — Patient Instructions (Addendum)
It was great to see you!  Our plans for today:  - No changes to your medications today.  - We are checking some labs today, we will release these results to your MyChart. - Make sure to complete your Cologuard for colon cancer screening. - Come back in 3 months.   Take care and seek immediate care sooner if you develop any concerns.   Dr. Linwood Dibbles

## 2021-04-26 NOTE — Assessment & Plan Note (Signed)
Elevated today but not consistent with meds recently, under stress, and normal measurements at home. No changes to meds today. Will obtain labs. F/u in 3 months

## 2021-04-26 NOTE — Progress Notes (Signed)
   SUBJECTIVE:   CHIEF COMPLAINT / HPI:   Hypertension, OSA: - Medications: amlodipine, benicar - Compliance: has not been consistent with meds last few days - Checking BP at home: not in a few weeks, usually 110-120s SBP - Denies any LE edema, medication SEs, or symptoms of hypotension - doesn't need CPAP on last sleep study.  - has been under more stress recently, running 2 businesses, pastors a church. - decrease in physical activity lately. Has membership at [facility].  HLD - medications: lipitor - compliance: has missed about half of doses over the last month. - medication SEs: none  GERD - Meds: prilosec prn - Typically with heartburn. - Denies difficulty swallowing, dysphagia, early satiety, hematemesis, melena, and odynophagia denies melena, hematochezia, hematemesis, and coffee ground emesis.  - Previous treatment: antacids and proton pump inhibitors.  OBJECTIVE:   BP (!) 146/76   Pulse 91   Temp 97.6 F (36.4 C)   Resp 16   Ht $R'5\' 11"'Gi$  (1.803 m)   Wt 277 lb 4.8 oz (125.8 kg)   SpO2 96%   BMI 38.68 kg/m   Gen: well appearing, obese, in NAD Card: RRR Lungs: CTAB Ext: WWP, no edema   ASSESSMENT/PLAN:   Essential hypertension Elevated today but not consistent with meds recently, under stress, and normal measurements at home. No changes to meds today. Will obtain labs. F/u in 3 months  Pre-diabetes Recheck a1c.  Hyperlipidemia Recommend consistency with medication. Recheck LDL today.   Health Maintenance - recommend completing colon cancer screening, has Cologuard kit at home.    Myles Gip, DO

## 2021-04-26 NOTE — Assessment & Plan Note (Signed)
Recheck a1c. °

## 2021-04-27 ENCOUNTER — Ambulatory Visit: Payer: BC Managed Care – PPO | Admitting: Family Medicine

## 2021-04-27 DIAGNOSIS — I10 Essential (primary) hypertension: Secondary | ICD-10-CM

## 2021-04-27 DIAGNOSIS — E785 Hyperlipidemia, unspecified: Secondary | ICD-10-CM

## 2021-04-27 DIAGNOSIS — R7303 Prediabetes: Secondary | ICD-10-CM

## 2021-04-27 DIAGNOSIS — Z5181 Encounter for therapeutic drug level monitoring: Secondary | ICD-10-CM

## 2021-04-27 LAB — BASIC METABOLIC PANEL
BUN: 14 mg/dL (ref 7–25)
CO2: 29 mmol/L (ref 20–32)
Calcium: 9.3 mg/dL (ref 8.6–10.3)
Chloride: 102 mmol/L (ref 98–110)
Creat: 0.94 mg/dL (ref 0.70–1.35)
Glucose, Bld: 103 mg/dL — ABNORMAL HIGH (ref 65–99)
Potassium: 3.9 mmol/L (ref 3.5–5.3)
Sodium: 140 mmol/L (ref 135–146)

## 2021-04-27 LAB — HEMOGLOBIN A1C
Hgb A1c MFr Bld: 6 % of total Hgb — ABNORMAL HIGH (ref <5.7)
Mean Plasma Glucose: 126 mg/dL
eAG (mmol/L): 7 mmol/L

## 2021-04-27 LAB — LDL CHOLESTEROL, DIRECT: Direct LDL: 113 mg/dL — ABNORMAL HIGH (ref <100)

## 2021-06-28 ENCOUNTER — Other Ambulatory Visit: Payer: Self-pay | Admitting: Family Medicine

## 2021-06-28 DIAGNOSIS — J309 Allergic rhinitis, unspecified: Secondary | ICD-10-CM

## 2021-06-29 NOTE — Telephone Encounter (Signed)
Requested Prescriptions  Pending Prescriptions Disp Refills   mometasone (NASONEX) 50 MCG/ACT nasal spray [Pharmacy Med Name: MOMETASONE FUROATE 50 MCG SPRY] 1 g 1    Sig: PLACE 2 SPRAYS INTO THE NOSE DAILY.     Ear, Nose, and Throat: Nasal Preparations - Corticosteroids Passed - 06/28/2021  8:22 AM      Passed - Valid encounter within last 12 months    Recent Outpatient Visits          2 months ago Essential hypertension   [facility] Caro Laroche, DO   7 months ago COVID-19   [facility] Gabriel Cirri, NP   8 months ago Essential hypertension   [facility] [name], PA-C   1 year ago Annual physical exam   [facility] Jamelle Haring, MD   1 year ago Essential hypertension   [facility] [name], PA-C      Future Appointments            In 3 months [name], PA-C [facility]

## 2021-10-24 ENCOUNTER — Ambulatory Visit: Payer: BC Managed Care – PPO | Admitting: Family Medicine

## 2021-10-26 ENCOUNTER — Encounter: Payer: Self-pay | Admitting: Family Medicine

## 2021-10-26 ENCOUNTER — Ambulatory Visit: Payer: BC Managed Care – PPO | Admitting: Family Medicine

## 2021-10-26 VITALS — BP 112/66 | HR 74 | Resp 16 | Ht 71.0 in | Wt 271.0 lb

## 2021-10-26 DIAGNOSIS — Z6837 Body mass index (BMI) 37.0-37.9, adult: Secondary | ICD-10-CM

## 2021-10-26 DIAGNOSIS — I10 Essential (primary) hypertension: Secondary | ICD-10-CM | POA: Diagnosis not present

## 2021-10-26 DIAGNOSIS — Z5181 Encounter for therapeutic drug level monitoring: Secondary | ICD-10-CM

## 2021-10-26 DIAGNOSIS — R7303 Prediabetes: Secondary | ICD-10-CM | POA: Diagnosis not present

## 2021-10-26 DIAGNOSIS — L299 Pruritus, unspecified: Secondary | ICD-10-CM

## 2021-10-26 DIAGNOSIS — E785 Hyperlipidemia, unspecified: Secondary | ICD-10-CM

## 2021-10-26 DIAGNOSIS — K219 Gastro-esophageal reflux disease without esophagitis: Secondary | ICD-10-CM | POA: Insufficient documentation

## 2021-10-26 MED ORDER — CLOTRIMAZOLE-BETAMETHASONE 1-0.05 % EX CREA: 1.0000 "application " | TOPICAL_CREAM | Freq: Every day | CUTANEOUS | 2 refills | Status: DC

## 2021-10-26 NOTE — Progress Notes (Signed)
? ?Name: Tristan Miller   MRN: [id number]    DOB: [birth year]   Date:10/26/2021 ? ?     Progress Note ? ?Chief Complaint  ?Patient presents with  ? Follow-up  ? ? ? ?Subjective:  ? ?Tristan Miller is a 66 y.o. male, presents to clinic for routine f/up ? ?Hypertension:  ?Currently managed on olmesartan-HCTZ, amlodipine ?Pt reports good med compliance and denies any SE.   ?Blood pressure today is well controlled - BP has improved over the past year with loosing weight ?BP Readings from Last 10 Encounters:  ?10/26/21 112/66  ?04/26/21 (!) 146/76  ?10/25/20 122/70  ?06/09/20 128/76  ?01/28/20 122/76  ?08/12/18 122/80  ?05/14/18 130/82  ?04/08/18 138/76  ?11/09/16 140/80  ?10/31/16 129/80  ?Last week on meds 118/78 ?Pt denies CP, SOB, exertional sx, LE edema, palpitation, Ha's, visual disturbances, lightheadedness, hypotension, syncope.    ? ?Hyperlipidemia: lipitor 40 mg  ?Currently treated with atorvastatin 40 mg, pt reports fair med compliance - sometimes with bedtime dosing he forgets ?Last Lipids: ?Lab Results  ?Component Value Date  ? CHOL 194 10/25/2020  ? HDL 44 10/25/2020  ? LDLCALC 131 (H) 10/25/2020  ? LDLDIRECT 113 (H) 04/26/2021  ? TRIG 88 10/25/2020  ? CHOLHDL 4.4 10/25/2020  ? ?- Denies: Chest pain, shortness of breath, myalgias, claudication ? ?GERD: ?- Current medication regimen:  omeprazole prn  ?- Possible Triggers: no or minimal alcohol, nonsmoker, no or mild caffeine use, no ASA or NSAID's lying down after eating  no ETOH, no NSAIDs, non-smoker, coffee once a day   ?- Endorses:   reflux, indigestion, sometimes nausea ?- Denies: abdominal pain, chest pain, cough, wheezing, weight loss, dysphagia, black stools, hematemesis, diarrhea, constipation, fever, history of PUD, or history of GI bleeding ? ?Prediabetes: ?Recent pertinent labs: ?Lab Results  ?Component Value Date  ? HGBA1C 6.0 (H) 04/26/2021  ? HGBA1C 6.0 (H) 10/25/2020  ? HGBA1C 6.2 (H) 01/28/2020  ? ? ?Obesity: ?Loosing the weight he gained  since COVID - about 30 lbs down ? ?Wt Readings from Last 10 Encounters:  ?10/26/21 271 lb (122.9 kg)  ?04/26/21 277 lb 4.8 oz (125.8 kg)  ?12/01/20 279 lb (126.6 kg)  ?10/25/20 282 lb 8 oz (128.1 kg)  ?06/09/20 296 lb 4.8 oz (134.4 kg)  ?01/28/20 (!) 304 lb 6.4 oz (138.1 kg)  ?01/08/20 297 lb (134.7 kg)  ?08/12/18 297 lb (134.7 kg)  ?05/14/18 297 lb (134.7 kg)  ?04/08/18 296 lb 4.8 oz (134.4 kg)  ? ?BMI Readings from Last 5 Encounters:  ?10/26/21 37.80 kg/m?  ?04/26/21 38.68 kg/m?  ?12/01/20 38.91 kg/m?  ?10/25/20 39.40 kg/m?  ?06/09/20 41.33 kg/m?  ? ? ?Rash/itching to back - used to get a med from Dr. Rutherford Nail but doesn't know what its called and it was not prescribed ? ? ? ?Current Outpatient Medications:  ?  amLODipine (NORVASC) 5 MG tablet, Take 1 tablet (5 mg total) by mouth daily., Disp: 90 tablet, Rfl: 3 ?  atorvastatin (LIPITOR) 40 MG tablet, TAKE 1 TABLET BY MOUTH EVERY DAY, Disp: 90 tablet, Rfl: 2 ?  mometasone (NASONEX) 50 MCG/ACT nasal spray, PLACE 2 SPRAYS INTO THE NOSE DAILY., Disp: 1 g, Rfl: 1 ?  olmesartan-hydrochlorothiazide (BENICAR HCT) 40-25 MG tablet, Take 1 tablet by mouth daily., Disp: 90 tablet, Rfl: 3 ?  omeprazole (PRILOSEC) 20 MG capsule, TAKE 1 CAPSULE BY MOUTH DAILY AS NEEDED., Disp: 90 capsule, Rfl: 1 ? ?Patient Active Problem List  ? Diagnosis Date Noted  ?  Umbilical hernia without obstruction and without gangrene 06/09/2020  ? Osteoarthritis of right knee 06/09/2020  ? Morbid obesity ([place]) 05/14/2018  ? OSA (obstructive sleep apnea) 05/02/2015  ? Pre-diabetes 05/02/2015  ? Essential hypertension 05/02/2015  ? Hyperlipidemia 05/02/2015  ? ? ?Past Surgical History:  ?Procedure Laterality Date  ? NO PAST SURGERIES    ? ? ?No family history on file. ? ?Social History  ? ?Tobacco Use  ? Smoking status: Never  ? Smokeless tobacco: Never  ?Vaping Use  ? Vaping Use: Never used  ?Substance Use Topics  ? Alcohol use: No  ?  Alcohol/week: 0.0 standard drinks  ?  Comment: quit 30 years ago  ?  Drug use: No  ?  ? ?No Known Allergies ? ?Health Maintenance  ?Topic Date Due  ? Fecal DNA (Cologuard)  Never done  ? Zoster Vaccines- Shingrix (1 of 2) Never done  ? COVID-19 Vaccine (4 - Booster for Pfizer series) 06/09/2020  ? INFLUENZA VACCINE  01/23/2022  ? TETANUS/TDAP  04/08/2028  ? Hepatitis C Screening  Completed  ? HIV Screening  Completed  ? HPV VACCINES  Aged Out  ? ? ?Chart Review Today: ?I personally reviewed active problem list, medication list, allergies, family history, social history, health maintenance, notes from last encounter, lab results, imaging with the patient/caregiver today. ? ? ?Review of Systems  ?Constitutional: Negative.   ?HENT: Negative.    ?Eyes: Negative.   ?Respiratory: Negative.    ?Cardiovascular: Negative.   ?Gastrointestinal: Negative.   ?Endocrine: Negative.   ?Genitourinary: Negative.   ?Musculoskeletal: Negative.   ?Skin: Negative.   ?Allergic/Immunologic: Negative.   ?Neurological: Negative.   ?Hematological: Negative.   ?Psychiatric/Behavioral: Negative.    ?All other systems reviewed and are negative.  ? ?Objective:  ? ?Vitals:  ? 10/26/21 1129  ?BP: 112/66  ?Pulse: 74  ?Resp: 16  ?SpO2: 98%  ?Weight: 271 lb (122.9 kg)  ?Height: 5\' 11"  (1.803 m)  ? ? ?Body mass index is 37.8 kg/m?. ? ?Physical Exam ?Vitals and nursing note reviewed.  ?Constitutional:   ?   General: He is not in acute distress. ?   Appearance: Normal appearance. He is well-developed. He is obese. He is not ill-appearing, toxic-appearing or diaphoretic.  ?   Interventions: Face mask in place.  ?HENT:  ?   Head: Normocephalic and atraumatic.  ?   Jaw: No trismus.  ?   Right Ear: External ear normal.  ?   Left Ear: External ear normal.  ?Eyes:  ?   General: Lids are normal. No scleral icterus.    ?   Right eye: No discharge.     ?   Left eye: No discharge.  ?   Conjunctiva/sclera: Conjunctivae normal.  ?Neck:  ?   Trachea: Trachea and phonation normal. No tracheal deviation.  ?Cardiovascular:  ?   Rate and  Rhythm: Normal rate and regular rhythm.  ?   Pulses: Normal pulses.     ?     Radial pulses are 2+ on the right side and 2+ on the left side.  ?     Posterior tibial pulses are 2+ on the right side and 2+ on the left side.  ?   Heart sounds: Normal heart sounds. No murmur heard. ?  No friction rub. No gallop.  ?Pulmonary:  ?   Effort: Pulmonary effort is normal. No respiratory distress.  ?   Breath sounds: Normal breath sounds. No stridor. No wheezing, rhonchi or rales.  ?Abdominal:  ?  General: Bowel sounds are normal. There is no distension.  ?   Palpations: Abdomen is soft.  ?Musculoskeletal:  ?   Right lower leg: No edema.  ?   Left lower leg: No edema.  ?Skin: ?   General: Skin is warm and dry.  ?   Coloration: Skin is not jaundiced.  ?   Findings: No lesion or rash.  ?   Nails: There is no clubbing.  ?Neurological:  ?   Mental Status: He is alert. Mental status is at baseline.  ?   Cranial Nerves: No dysarthria or facial asymmetry.  ?   Motor: No tremor or abnormal muscle tone.  ?   Gait: Gait normal.  ?Psychiatric:     ?   Mood and Affect: Mood normal.     ?   Speech: Speech normal.     ?   Behavior: Behavior normal. Behavior is cooperative.  ?  ? ? ? ? ?Assessment & Plan:  ? ?  ICD-10-CM   ?1. Essential hypertension  99991111 COMPLETE METABOLIC PANEL WITH GFR  ? Blood pressure at goal, well controlled today can consider discontinuing amlodipine, 1 month BP f/up, continue Benicar  ?  ?2. Hyperlipidemia, unspecified hyperlipidemia type  [id number] COMPLETE METABOLIC PANEL WITH GFR  ?  Lipid panel  ? Good med compliance  ?  ?3. Prediabetes  [id number] COMPLETE METABOLIC PANEL WITH GFR  ?  Hemoglobin A1c  ? Reviewed A1c, recheck today with weight loss  ?  ?4. Encounter for medication monitoring  [id number] COMPLETE METABOLIC PANEL WITH GFR  ?  Lipid panel  ?  Hemoglobin A1c  ?  CBC with Differential/Platelet  ?  ?5. Pruritic condition  L29.9 clotrimazole-betamethasone (LOTRISONE) cream  ? To back patient does not know what he is  used in the past that was helpful, nothing noted on skin on back can try cortisone 10 over-the-counter  ?  ?6. Class 2 severe obesity with serious comorbidity and body mass index (BMI) of 37.0 to 37.9

## 2021-10-27 LAB — LIPID PANEL
Cholesterol: 152 mg/dL (ref <200)
HDL: 45 mg/dL (ref >=40)
LDL Cholesterol (Calc): 93 mg/dL (calc)
Non-HDL Cholesterol (Calc): 107 mg/dL (calc) (ref <130)
Total CHOL/HDL Ratio: 3.4 (calc) (ref <5.0)
Triglycerides: 58 mg/dL (ref <150)

## 2021-10-27 LAB — CBC WITH DIFFERENTIAL/PLATELET
Absolute Monocytes: 460 cells/uL (ref 200–950)
Basophils Absolute: 10 cells/uL (ref 0–200)
Basophils Relative: 0.2 %
Eosinophils Absolute: 40 cells/uL (ref 15–500)
Eosinophils Relative: 0.8 %
HCT: 44.5 % (ref 38.5–50.0)
Hemoglobin: 14 g/dL (ref 13.2–17.1)
Lymphs Abs: 1785 cells/uL (ref 850–3900)
MCH: 26.3 pg — ABNORMAL LOW (ref 27.0–33.0)
MCHC: 31.5 g/dL — ABNORMAL LOW (ref 32.0–36.0)
MCV: 83.6 fL (ref 80.0–100.0)
MPV: 11.8 fL (ref 7.5–12.5)
Monocytes Relative: 9.2 %
Neutro Abs: 2705 cells/uL (ref 1500–7800)
Neutrophils Relative %: 54.1 %
Platelets: 217 10*3/uL (ref 140–400)
RBC: 5.32 10*6/uL (ref 4.20–5.80)
RDW: 13.2 % (ref 11.0–15.0)
Total Lymphocyte: 35.7 %
WBC: 5 10*3/uL (ref 3.8–10.8)

## 2021-10-27 LAB — COMPLETE METABOLIC PANEL WITH GFR
AG Ratio: 1.5 (calc) (ref 1.0–2.5)
ALT: 23 U/L (ref 9–46)
AST: 23 U/L (ref 10–35)
Albumin: 3.9 g/dL (ref 3.6–5.1)
Alkaline phosphatase (APISO): 66 U/L (ref 35–144)
BUN: 12 mg/dL (ref 7–25)
CO2: 29 mmol/L (ref 20–32)
Calcium: 9.2 mg/dL (ref 8.6–10.3)
Chloride: 103 mmol/L (ref 98–110)
Creat: 0.93 mg/dL (ref 0.70–1.35)
Globulin: 2.6 g/dL (calc) (ref 1.9–3.7)
Glucose, Bld: 101 mg/dL — ABNORMAL HIGH (ref 65–99)
Potassium: 4 mmol/L (ref 3.5–5.3)
Sodium: 140 mmol/L (ref 135–146)
Total Bilirubin: 0.5 mg/dL (ref 0.2–1.2)
Total Protein: 6.5 g/dL (ref 6.1–8.1)
eGFR: 92 mL/min/{1.73_m2} (ref >=60)

## 2021-10-27 LAB — HEMOGLOBIN A1C
Hgb A1c MFr Bld: 6.1 % of total Hgb — ABNORMAL HIGH (ref <5.7)
Mean Plasma Glucose: 128 mg/dL
eAG (mmol/L): 7.1 mmol/L

## 2021-10-30 ENCOUNTER — Other Ambulatory Visit: Payer: Self-pay | Admitting: Family Medicine

## 2021-10-30 MED ORDER — ROSUVASTATIN CALCIUM 20 MG PO TABS: 20.0000 mg | ORAL_TABLET | Freq: Every day | ORAL | 3 refills | Status: DC

## 2021-11-04 ENCOUNTER — Other Ambulatory Visit: Payer: Self-pay | Admitting: Family Medicine

## 2021-11-04 DIAGNOSIS — I10 Essential (primary) hypertension: Secondary | ICD-10-CM

## 2021-11-04 DIAGNOSIS — Z5181 Encounter for therapeutic drug level monitoring: Secondary | ICD-10-CM

## 2021-11-27 ENCOUNTER — Other Ambulatory Visit: Payer: Self-pay | Admitting: Family Medicine

## 2021-11-27 DIAGNOSIS — I10 Essential (primary) hypertension: Secondary | ICD-10-CM

## 2021-11-27 DIAGNOSIS — Z5181 Encounter for therapeutic drug level monitoring: Secondary | ICD-10-CM

## 2022-05-26 ENCOUNTER — Other Ambulatory Visit: Payer: Self-pay | Admitting: Family Medicine

## 2022-06-13 ENCOUNTER — Encounter: Payer: Self-pay | Admitting: Family Medicine

## 2022-06-13 ENCOUNTER — Ambulatory Visit: Payer: BC Managed Care – PPO | Admitting: Family Medicine

## 2022-06-13 VITALS — BP 130/80 | HR 74 | Temp 97.7°F | Resp 16 | Ht 71.0 in | Wt 266.2 lb

## 2022-06-13 DIAGNOSIS — R7303 Prediabetes: Secondary | ICD-10-CM | POA: Diagnosis not present

## 2022-06-13 DIAGNOSIS — E785 Hyperlipidemia, unspecified: Secondary | ICD-10-CM | POA: Diagnosis not present

## 2022-06-13 DIAGNOSIS — J069 Acute upper respiratory infection, unspecified: Secondary | ICD-10-CM

## 2022-06-13 DIAGNOSIS — I10 Essential (primary) hypertension: Secondary | ICD-10-CM

## 2022-06-13 DIAGNOSIS — J309 Allergic rhinitis, unspecified: Secondary | ICD-10-CM | POA: Diagnosis not present

## 2022-06-13 DIAGNOSIS — J329 Chronic sinusitis, unspecified: Secondary | ICD-10-CM

## 2022-06-13 MED ORDER — DOXYCYCLINE HYCLATE 100 MG PO TABS: 100.0000 mg | ORAL_TABLET | Freq: Two times a day (BID) | ORAL | 0 refills | Status: AC

## 2022-06-13 MED ORDER — BENZONATATE 100 MG PO CAPS: 100.0000 mg | ORAL_CAPSULE | Freq: Two times a day (BID) | ORAL | 1 refills | Status: DC | PRN

## 2022-06-13 MED ORDER — LEVOCETIRIZINE DIHYDROCHLORIDE 5 MG PO TABS: 5.0000 mg | ORAL_TABLET | Freq: Every evening | ORAL | 5 refills | Status: DC

## 2022-06-13 NOTE — Progress Notes (Signed)
Patient ID: Tristan Miller, male    DOB: [birth year], 66 y.o.   MRN: [id number]  PCP: Delsa Grana, PA-C  Chief Complaint  Patient presents with   Nasal Congestion   Sinusitis    Facial pressure, slight cough onset for weeks    Subjective:   Tristan Miller is a 66 y.o. male, presents to clinic with CC of the following:  HPI  Presents for sinus issues - sinus pressure for several days last week and got worse on Monday (2 d ago)  On chart he has steroid nasal spray prescribed but no dx on problem list (allergies? Nonallergic vs acute rhinosinusitis) there is past dx of allergies  He was really feeling more stuffy and congested on Monday and now is draining  Treating with some OTC meds - small dose of mucinex  Not on his nose spray  Overdue for f/up on HTN, HLD, and prediabetes  Previously statin med changed - se with lipitor HTN on benicar BP Readings from Last 3 Encounters:  06/13/22 130/80  10/26/21 112/66  04/26/21 (!) 146/76   Prediabetes, no strict diet/lifestyle efforts but he has lost some weight Wt Readings from Last 5 Encounters:  06/13/22 266 lb 3.2 oz (120.7 kg)  10/26/21 271 lb (122.9 kg)  04/26/21 277 lb 4.8 oz (125.8 kg)  12/01/20 279 lb (126.6 kg)  10/25/20 282 lb 8 oz (128.1 kg)   BMI Readings from Last 5 Encounters:  06/13/22 37.13 kg/m  10/26/21 37.80 kg/m  04/26/21 38.68 kg/m  12/01/20 38.91 kg/m  10/25/20 39.40 kg/m       Patient Active Problem List   Diagnosis Date Noted   Gastroesophageal reflux disease 66/59/9357   Umbilical hernia without obstruction and without gangrene 06/09/2020   Osteoarthritis of right knee 06/09/2020   Class 2 severe obesity with serious comorbidity and body mass index (BMI) of 37.0 to 37.9 in adult ([place]) 05/14/2018   Prediabetes 04/17/2016   OSA (obstructive sleep apnea) 05/02/2015   Essential hypertension 05/02/2015   Hyperlipidemia 05/02/2015      Current Outpatient Medications:     clotrimazole-betamethasone (LOTRISONE) cream, Apply 1 application. topically daily., Disp: 30 g, Rfl: 2   mometasone (NASONEX) 50 MCG/ACT nasal spray, PLACE 2 SPRAYS INTO THE NOSE DAILY., Disp: 1 g, Rfl: 1   olmesartan-hydrochlorothiazide (BENICAR HCT) 40-25 MG tablet, TAKE 1 TABLET BY MOUTH EVERY DAY, Disp: 90 tablet, Rfl: 3   omeprazole (PRILOSEC) 20 MG capsule, TAKE 1 CAPSULE BY MOUTH EVERY DAY AS NEEDED, Disp: 90 capsule, Rfl: 1   rosuvastatin (CRESTOR) 20 MG tablet, Take 1 tablet (20 mg total) by mouth at bedtime., Disp: 90 tablet, Rfl: 3   No Known Allergies   Social History   Tobacco Use   Smoking status: Never   Smokeless tobacco: Never  Vaping Use   Vaping Use: Never used  Substance Use Topics   Alcohol use: No    Alcohol/week: 0.0 standard drinks of alcohol    Comment: quit 30 years ago   Drug use: No      Chart Review Today: I personally reviewed active problem list, medication list, allergies, family history, social history, health maintenance, notes from last encounter, lab results, imaging with the patient/caregiver today.   Review of Systems  Constitutional: Negative.   HENT: Negative.    Eyes: Negative.   Respiratory: Negative.    Cardiovascular: Negative.   Gastrointestinal: Negative.   Endocrine: Negative.   Genitourinary: Negative.   Musculoskeletal: Negative.  Skin: Negative.   Allergic/Immunologic: Negative.   Neurological: Negative.   Hematological: Negative.   Psychiatric/Behavioral: Negative.    All other systems reviewed and are negative.      Objective:   Vitals:   06/13/22 1419  BP: 130/80  Pulse: 74  Resp: 16  Temp: 97.7 F (36.5 C)  TempSrc: Oral  SpO2: 98%  Weight: 266 lb 3.2 oz (120.7 kg)  Height: _0  (1.803 m)    Body mass index is 37.13 kg/m.  Physical Exam Vitals and nursing note reviewed.  Constitutional:      General: He is not in acute distress.    Appearance: Normal appearance. He is well-developed and  well-groomed. He is obese. He is not ill-appearing, toxic-appearing or diaphoretic.  HENT:     Head: Normocephalic and atraumatic.     Jaw: No trismus.     Right Ear: Tympanic membrane, ear canal and external ear normal.     Left Ear: Tympanic membrane, ear canal and external ear normal.     Nose: Mucosal edema, congestion and rhinorrhea present. Rhinorrhea is purulent.     Right Sinus: Maxillary sinus tenderness present. No frontal sinus tenderness.     Left Sinus: Maxillary sinus tenderness present. No frontal sinus tenderness.     Mouth/Throat:     Mouth: Mucous membranes are moist.     Pharynx: Oropharynx is clear. Uvula midline. Posterior oropharyngeal erythema present. No pharyngeal swelling, oropharyngeal exudate or uvula swelling.     Tonsils: No tonsillar exudate. 0 on the right. 0 on the left.  Eyes:     General: Lids are normal.     Conjunctiva/sclera: Conjunctivae normal.     Pupils: Pupils are equal, round, and reactive to light.  Neck:     Trachea: Trachea and phonation normal. No tracheal deviation.  Cardiovascular:     Rate and Rhythm: Normal rate and regular rhythm.     Pulses: Normal pulses.          Radial pulses are 2+ on the right side and 2+ on the left side.       Posterior tibial pulses are 2+ on the right side and 2+ on the left side.     Heart sounds: Normal heart sounds. No murmur heard.    No friction rub. No gallop.  Pulmonary:     Effort: Pulmonary effort is normal.     Breath sounds: Normal breath sounds. No wheezing, rhonchi or rales.  Abdominal:     General: Bowel sounds are normal. There is no distension.     Palpations: Abdomen is soft.     Tenderness: There is no abdominal tenderness. There is no guarding or rebound.  Musculoskeletal:        General: Normal range of motion.     Cervical back: Normal range of motion and neck supple.  Skin:    General: Skin is warm and dry.     Capillary Refill: Capillary refill takes less than 2 seconds.      Findings: No rash.  Neurological:     Mental Status: He is alert and oriented to person, place, and time.     Gait: Gait normal.  Psychiatric:        Speech: Speech normal.        Behavior: Behavior normal. Behavior is cooperative.      Results for orders placed or performed in visit on 10/26/21  COMPLETE METABOLIC PANEL WITH GFR  Result Value Ref Range   Glucose, Bld  101 (H) 65 - 99 mg/dL   BUN 12 7 - 25 mg/dL   Creat 0.93 0.70 - 1.35 mg/dL   eGFR 92 > OR = 60 mL/min/1.5m   BUN/Creatinine Ratio NOT APPLICABLE 6 - 22 (calc)   Sodium 140 135 - 146 mmol/L   Potassium 4.0 3.5 - 5.3 mmol/L   Chloride 103 98 - 110 mmol/L   CO2 29 20 - 32 mmol/L   Calcium 9.2 8.6 - 10.3 mg/dL   Total Protein 6.5 6.1 - 8.1 g/dL   Albumin 3.9 3.6 - 5.1 g/dL   Globulin 2.6 1.9 - 3.7 g/dL (calc)   AG Ratio 1.5 1.0 - 2.5 (calc)   Total Bilirubin 0.5 0.2 - 1.2 mg/dL   Alkaline phosphatase (APISO) 66 35 - 144 U/L   AST 23 10 - 35 U/L   ALT 23 9 - 46 U/L  Lipid panel  Result Value Ref Range   Cholesterol 152 <200 mg/dL   HDL 45 > OR = 40 mg/dL   Triglycerides 58 <150 mg/dL   LDL Cholesterol (Calc) 93 mg/dL (calc)   Total CHOL/HDL Ratio 3.4 <5.0 (calc)   Non-HDL Cholesterol (Calc) 107 <130 mg/dL (calc)  Hemoglobin A1c  Result Value Ref Range   Hgb A1c MFr Bld 6.1 (H) <5.7 % of total Hgb   Mean Plasma Glucose 128 mg/dL   eAG (mmol/L) 7.1 mmol/L  CBC with Differential/Platelet  Result Value Ref Range   WBC 5.0 3.8 - 10.8 Thousand/uL   RBC 5.32 4.20 - 5.80 Million/uL   Hemoglobin 14.0 13.2 - 17.1 g/dL   HCT 44.5 38.5 - 50.0 %   MCV 83.6 80.0 - 100.0 fL   MCH 26.3 (L) 27.0 - 33.0 pg   MCHC 31.5 (L) 32.0 - 36.0 g/dL   RDW 13.2 11.0 - 15.0 %   Platelets 217 140 - 400 Thousand/uL   MPV 11.8 7.5 - 12.5 fL   Neutro Abs 2,705 1,500 - 7,800 cells/uL   Lymphs Abs 1,785 850 - 3,900 cells/uL   Absolute Monocytes 460 200 - 950 cells/uL   Eosinophils Absolute 40 15 - 500 cells/uL   Basophils Absolute  10 0 - 200 cells/uL   Neutrophils Relative % 54.1 %   Total Lymphocyte 35.7 %   Monocytes Relative 9.2 %   Eosinophils Relative 0.8 %   Basophils Relative 0.2 %       Assessment & Plan:     ICD-10-CM   1. Rhinosinusitis  J32.9 levocetirizine (XYZAL) 5 MG tablet    doxycycline (VIBRA-TABS) 100 MG tablet   recurrent, sx for weeks and acutely worsening, restart antihistamine meds, nose sprays and tx with abx for ABS    2. Allergic rhinitis, unspecified seasonality, unspecified trigger  J30.9 levocetirizine (XYZAL) 5 MG tablet   resume antihistamines and intranasal steroid sprays - severe swelling on exam, maintenence meds to hopeully avoid ABS in the future    3. Prediabetes  RI34.74COMPLETE METABOLIC PANEL WITH GFR    Hemoglobin A1c   recheck labs, overdue for routine f/up    4. Hyperlipidemia, unspecified hyperlipidemia type  EQ59.5COMPLETE METABOLIC PANEL WITH GFR    Lipid panel   restarted statin earlier this year did not f/up, taking every couple days, initially had SE that resolved, recheck lipids    5. Essential hypertension  IG38COMPLETE METABOLIC PANEL WITH GFR   BP at goal today on benicar, overdue for labs, continue same meds and DASH efforts    6. Upper  respiratory tract infection, unspecified type  J06.9 benzonatate (TESSALON) 100 MG capsule   sx onset weeks ago with uncontrolled allergies as well, restart meds above, steroid nose sprays, can try tessalon, mucinex OTC          Delsa Grana, PA-C 06/13/22 2:23 PM

## 2022-06-14 ENCOUNTER — Other Ambulatory Visit: Payer: Self-pay | Admitting: Family Medicine

## 2022-06-14 DIAGNOSIS — E785 Hyperlipidemia, unspecified: Secondary | ICD-10-CM

## 2022-06-14 LAB — LIPID PANEL
Cholesterol: 169 mg/dL (ref <200)
HDL: 51 mg/dL (ref >=40)
LDL Cholesterol (Calc): 93 mg/dL (calc)
Non-HDL Cholesterol (Calc): 118 mg/dL (calc) (ref <130)
Total CHOL/HDL Ratio: 3.3 (calc) (ref <5.0)
Triglycerides: 150 mg/dL — ABNORMAL HIGH (ref <150)

## 2022-06-14 LAB — COMPLETE METABOLIC PANEL WITH GFR
AG Ratio: 1.4 (calc) (ref 1.0–2.5)
ALT: 21 U/L (ref 9–46)
AST: 17 U/L (ref 10–35)
Albumin: 4 g/dL (ref 3.6–5.1)
Alkaline phosphatase (APISO): 64 U/L (ref 35–144)
BUN: 10 mg/dL (ref 7–25)
CO2: 31 mmol/L (ref 20–32)
Calcium: 9.3 mg/dL (ref 8.6–10.3)
Chloride: 103 mmol/L (ref 98–110)
Creat: 0.88 mg/dL (ref 0.70–1.35)
Globulin: 2.8 g/dL (calc) (ref 1.9–3.7)
Glucose, Bld: 102 mg/dL — ABNORMAL HIGH (ref 65–99)
Potassium: 3.7 mmol/L (ref 3.5–5.3)
Sodium: 142 mmol/L (ref 135–146)
Total Bilirubin: 0.3 mg/dL (ref 0.2–1.2)
Total Protein: 6.8 g/dL (ref 6.1–8.1)
eGFR: 96 mL/min/{1.73_m2} (ref >=60)

## 2022-06-14 LAB — HEMOGLOBIN A1C
Hgb A1c MFr Bld: 6.1 % of total Hgb — ABNORMAL HIGH (ref <5.7)
Mean Plasma Glucose: 128 mg/dL
eAG (mmol/L): 7.1 mmol/L

## 2022-06-14 MED ORDER — ROSUVASTATIN CALCIUM 10 MG PO TABS: 10.0000 mg | ORAL_TABLET | Freq: Every day | ORAL | 1 refills | Status: DC

## 2022-06-20 ENCOUNTER — Encounter: Payer: Self-pay | Admitting: Family Medicine

## 2022-12-17 ENCOUNTER — Encounter: Payer: BC Managed Care – PPO | Admitting: Family Medicine

## 2022-12-18 ENCOUNTER — Encounter: Payer: BC Managed Care – PPO | Admitting: Family Medicine

## 2022-12-31 NOTE — Patient Instructions (Incomplete)
Preventive Care 65 Years and Older, Male Preventive care refers to lifestyle choices and visits with your health care provider that can promote health and wellness. Preventive care visits are also called wellness exams. What can I expect for my preventive care visit? Counseling During your preventive care visit, your health care provider may ask about your: Medical history, including: Past medical problems. Family medical history. History of falls. Current health, including: Emotional well-being. Home life and relationship well-being. Sexual activity. Memory and ability to understand (cognition). Lifestyle, including: Alcohol, nicotine or tobacco, and drug use. Access to firearms. Diet, exercise, and sleep habits. Work and work environment. Sunscreen use. Safety issues such as seatbelt and bike helmet use. Physical exam Your health care provider will check your: Height and weight. These may be used to calculate your BMI (body mass index). BMI is a measurement that tells if you are at a healthy weight. Waist circumference. This measures the distance around your waistline. This measurement also tells if you are at a healthy weight and may help predict your risk of certain diseases, such as type 2 diabetes and high blood pressure. Heart rate and blood pressure. Body temperature. Skin for abnormal spots. What immunizations do I need?  Vaccines are usually given at various ages, according to a schedule. Your health care provider will recommend vaccines for you based on your age, medical history, and lifestyle or other factors, such as travel or where you work. What tests do I need? Screening Your health care provider may recommend screening tests for certain conditions. This may include: Lipid and cholesterol levels. Diabetes screening. This is done by checking your blood sugar (glucose) after you have not eaten for a while (fasting). Hepatitis C test. Hepatitis B test. HIV (human  immunodeficiency virus) test. STI (sexually transmitted infection) testing, if you are at risk. Lung cancer screening. Colorectal cancer screening. Prostate cancer screening. Abdominal aortic aneurysm (AAA) screening. You may need this if you are a current or former smoker. Talk with your health care provider about your test results, treatment options, and if necessary, the need for more tests. Follow these instructions at home: Eating and drinking  Eat a diet that includes fresh fruits and vegetables, whole grains, lean protein, and low-fat dairy products. Limit your intake of foods with high amounts of sugar, saturated fats, and salt. Take vitamin and mineral supplements as recommended by your health care provider. Do not drink alcohol if your health care provider tells you not to drink. If you drink alcohol: Limit how much you have to 0-2 drinks a day. Know how much alcohol is in your drink. In the [place] one drink equals one 12 oz bottle of beer (355 mL), one 5 oz glass of wine (148 mL), or one 1 oz glass of hard liquor (44 mL). Lifestyle Brush your teeth every morning and night with fluoride toothpaste. Floss one time each day. Exercise for at least 30 minutes 5 or more days each week. Do not use any products that contain nicotine or tobacco. These products include cigarettes, chewing tobacco, and vaping devices, such as e-cigarettes. If you need help quitting, ask your health care provider. Do not use drugs. If you are sexually active, practice safe sex. Use a condom or other form of protection to prevent STIs. Take aspirin only as told by your health care provider. Make sure that you understand how much to take and what form to take. Work with your health care provider to find out whether it is safe   and beneficial for you to take aspirin daily. Ask your health care provider if you need to take a cholesterol-lowering medicine (statin). Find healthy ways to manage stress, such  as: Meditation, yoga, or listening to music. Journaling. Talking to a trusted person. Spending time with friends and family. Safety Always wear your seat belt while driving or riding in a vehicle. Do not drive: If you have been drinking alcohol. Do not ride with someone who has been drinking. When you are tired or distracted. While texting. If you have been using any mind-altering substances or drugs. Wear a helmet and other protective equipment during sports activities. If you have firearms in your house, make sure you follow all gun safety procedures. Minimize exposure to UV radiation to reduce your risk of skin cancer. What's next? Visit your health care provider once a year for an annual wellness visit. Ask your health care provider how often you should have your eyes and teeth checked. Stay up to date on all vaccines. This information is not intended to replace advice given to you by your health care provider. Make sure you discuss any questions you have with your health care provider. Document Revised: 12/07/2020 Document Reviewed: 12/07/2020 Elsevier Patient Education  2024 [employer]  

## 2023-01-01 ENCOUNTER — Encounter: Payer: Self-pay | Admitting: Family Medicine

## 2023-01-01 ENCOUNTER — Ambulatory Visit (INDEPENDENT_AMBULATORY_CARE_PROVIDER_SITE_OTHER): Payer: Medicare PPO | Admitting: Family Medicine

## 2023-01-01 VITALS — BP 134/80 | HR 68 | Temp 97.9°F | Resp 16 | Ht 71.0 in | Wt 268.0 lb

## 2023-01-01 DIAGNOSIS — J069 Acute upper respiratory infection, unspecified: Secondary | ICD-10-CM | POA: Diagnosis not present

## 2023-01-01 DIAGNOSIS — R052 Subacute cough: Secondary | ICD-10-CM | POA: Diagnosis not present

## 2023-01-01 DIAGNOSIS — Z0000 Encounter for general adult medical examination without abnormal findings: Secondary | ICD-10-CM

## 2023-01-01 DIAGNOSIS — Z125 Encounter for screening for malignant neoplasm of prostate: Secondary | ICD-10-CM

## 2023-01-01 DIAGNOSIS — Z1211 Encounter for screening for malignant neoplasm of colon: Secondary | ICD-10-CM

## 2023-01-01 DIAGNOSIS — E785 Hyperlipidemia, unspecified: Secondary | ICD-10-CM

## 2023-01-01 DIAGNOSIS — R7303 Prediabetes: Secondary | ICD-10-CM

## 2023-01-01 MED ORDER — PANTOPRAZOLE SODIUM 40 MG PO TBEC: 40.0000 mg | DELAYED_RELEASE_TABLET | Freq: Every day | ORAL | 3 refills | Status: DC

## 2023-01-01 MED ORDER — PREDNISONE 20 MG PO TABS: 40.0000 mg | ORAL_TABLET | Freq: Every day | ORAL | 0 refills | Status: AC

## 2023-01-01 MED ORDER — DOXYCYCLINE HYCLATE 100 MG PO TABS: 100.0000 mg | ORAL_TABLET | Freq: Two times a day (BID) | ORAL | 0 refills | Status: AC

## 2023-01-01 MED ORDER — BENZONATATE 100 MG PO CAPS: 100.0000 mg | ORAL_CAPSULE | Freq: Two times a day (BID) | ORAL | 1 refills | Status: DC | PRN

## 2023-01-01 NOTE — Progress Notes (Signed)
Patient ID: Tristan Miller, male    DOB: 1955/12/26, 67 y.o.   MRN: [id number]  PCP: [name], PA-C  Chief Complaint  Patient presents with   Cough    Cough up phlegm, was taking tessalon pills from previous prescriptions given to him   Nasal Congestion    Onset for 3 weeks    Subjective:   Tristan Miller is a 67 y.o. male, presents to clinic with CC of the following:  HPI  URI sx x 3 weeks, not improving, he has tried some OTC meds and prior tessalon  Does have history of recurrent sinus infections and bronchitis starting to feel somewhat similar to sinus infections in the past He is not currently having pain with breathing or shortness of breath    Patient Active Problem List   Diagnosis Date Noted   Gastroesophageal reflux disease 10/26/2021   Umbilical hernia without obstruction and without gangrene 06/09/2020   Osteoarthritis of right knee 06/09/2020   Class 2 severe obesity with serious comorbidity and body mass index (BMI) of 37.0 to 37.9 in adult [facility]) 05/14/2018   Prediabetes 04/17/2016   OSA (obstructive sleep apnea) 05/02/2015   Essential hypertension 05/02/2015   Hyperlipidemia 05/02/2015      Current Outpatient Medications:    olmesartan-hydrochlorothiazide (BENICAR HCT) 40-25 MG tablet, TAKE 1 TABLET BY MOUTH EVERY DAY, Disp: 90 tablet, Rfl: 3   omeprazole (PRILOSEC) 20 MG capsule, TAKE 1 CAPSULE BY MOUTH EVERY DAY AS NEEDED, Disp: 90 capsule, Rfl: 1   rosuvastatin (CRESTOR) 10 MG tablet, Take 1 tablet (10 mg total) by mouth daily., Disp: 90 tablet, Rfl: 1   benzonatate (TESSALON) 100 MG capsule, Take 1-2 capsules (100-200 mg total) by mouth 2 (two) times daily as needed for cough. (Patient not taking: Reported on 01/01/2023), Disp: 60 capsule, Rfl: 1   clotrimazole-betamethasone (LOTRISONE) cream, Apply 1 application. topically daily. (Patient not taking: Reported on 01/01/2023), Disp: 30 g, Rfl: 2   levocetirizine (XYZAL) 5 MG tablet, Take 1 tablet  (5 mg total) by mouth every evening. (Patient not taking: Reported on 01/01/2023), Disp: 30 tablet, Rfl: 5   mometasone (NASONEX) 50 MCG/ACT nasal spray, PLACE 2 SPRAYS INTO THE NOSE DAILY. (Patient not taking: Reported on 01/01/2023), Disp: 1 g, Rfl: 1   No Known Allergies   Social History   Tobacco Use   Smoking status: Never   Smokeless tobacco: Never  Vaping Use   Vaping Use: Never used  Substance Use Topics   Alcohol use: No    Alcohol/week: 0.0 standard drinks of alcohol    Comment: quit 30 years ago   Drug use: No      Chart Review Today: I personally reviewed active problem list, medication list, allergies, family history, social history, health maintenance, notes from last encounter, lab results, imaging with the patient/caregiver today.   Review of Systems  Constitutional: Negative.   HENT: Negative.    Eyes: Negative.   Respiratory: Negative.    Cardiovascular: Negative.   Gastrointestinal: Negative.   Endocrine: Negative.   Genitourinary: Negative.   Musculoskeletal: Negative.   Skin: Negative.   Allergic/Immunologic: Negative.   Neurological: Negative.   Hematological: Negative.   Psychiatric/Behavioral: Negative.    All other systems reviewed and are negative.      Objective:   Vitals:   01/01/23 0925  BP: 134/80  Pulse: 68  Resp: 16  Temp: 97.9 F (36.6 C)  TempSrc: Oral  SpO2: 97%  Weight: 268 lb (  121.6 kg)  Height: 5\' 11"  (1.803 m)    Body mass index is 37.38 kg/m.  Physical Exam Vitals and nursing note reviewed.  Constitutional:      General: He is not in acute distress.    Appearance: Normal appearance. He is well-developed. He is obese. He is not ill-appearing, toxic-appearing or diaphoretic.  HENT:     Head: Normocephalic and atraumatic.     Jaw: No trismus.     Right Ear: Tympanic membrane, ear canal and external ear normal.     Left Ear: Tympanic membrane, ear canal and external ear normal.     Nose: Mucosal edema, congestion  and rhinorrhea present.     Right Sinus: No maxillary sinus tenderness or frontal sinus tenderness.     Left Sinus: No maxillary sinus tenderness or frontal sinus tenderness.     Mouth/Throat:     Mouth: Mucous membranes are normal. Mucous membranes are not pale, not dry and not cyanotic.     Pharynx: Uvula midline. Posterior oropharyngeal erythema present. No oropharyngeal exudate, posterior oropharyngeal edema or uvula swelling.     Tonsils: No tonsillar exudate or tonsillar abscesses.  Eyes:     General: Lids are normal. No scleral icterus.       Right eye: No discharge.        Left eye: No discharge.     Extraocular Movements: EOM normal.     Conjunctiva/sclera: Conjunctivae normal.  Neck:     Trachea: Trachea and phonation normal. No tracheal deviation.  Cardiovascular:     Rate and Rhythm: Normal rate and regular rhythm.     Pulses:          Radial pulses are 2+ on the right side and 2+ on the left side.     Heart sounds: Normal heart sounds. No murmur heard.    No friction rub. No gallop.  Pulmonary:     Effort: Pulmonary effort is normal. No tachypnea, accessory muscle usage or respiratory distress.     Breath sounds: Normal breath sounds. No stridor. No decreased breath sounds, wheezing, rhonchi or rales.  Abdominal:     General: Bowel sounds are normal. There is no distension.     Palpations: Abdomen is soft.     Tenderness: There is no abdominal tenderness.  Musculoskeletal:        General: No edema. Normal range of motion.     Cervical back: Normal range of motion and neck supple.  Skin:    General: Skin is warm, dry and intact.     Capillary Refill: Capillary refill takes less than 2 seconds.     Coloration: Skin is not pale.     Findings: No rash.     Nails: There is no clubbing.  Neurological:     Mental Status: He is alert and oriented to person, place, and time.     Motor: No abnormal muscle tone.     Coordination: Coordination normal.     Gait: Gait normal.   Psychiatric:        Mood and Affect: Mood and affect normal.        Speech: Speech normal.        Behavior: Behavior normal. Behavior is cooperative.      Results for orders placed or performed in visit on 06/13/22  COMPLETE METABOLIC PANEL WITH GFR  Result Value Ref Range   Glucose, Bld 102 (H) 65 - 99 mg/dL   BUN 10 7 - 25 mg/dL  Creat 0.88 0.70 - 1.35 mg/dL   eGFR 96 > OR = 60 ZO/XWR/6.04V4   BUN/Creatinine Ratio SEE NOTE: 6 - 22 (calc)   Sodium 142 135 - 146 mmol/L   Potassium 3.7 3.5 - 5.3 mmol/L   Chloride 103 98 - 110 mmol/L   CO2 31 20 - 32 mmol/L   Calcium 9.3 8.6 - 10.3 mg/dL   Total Protein 6.8 6.1 - 8.1 g/dL   Albumin 4.0 3.6 - 5.1 g/dL   Globulin 2.8 1.9 - 3.7 g/dL (calc)   AG Ratio 1.4 1.0 - 2.5 (calc)   Total Bilirubin 0.3 0.2 - 1.2 mg/dL   Alkaline phosphatase (APISO) 64 35 - 144 U/L   AST 17 10 - 35 U/L   ALT 21 9 - 46 U/L  Hemoglobin A1c  Result Value Ref Range   Hgb A1c MFr Bld 6.1 (H) <5.7 % of total Hgb   Mean Plasma Glucose 128 mg/dL   eAG (mmol/L) 7.1 mmol/L  Lipid panel  Result Value Ref Range   Cholesterol 169 <200 mg/dL   HDL 51 > OR = 40 mg/dL   Triglycerides 098 (H) <150 mg/dL   LDL Cholesterol (Calc) 93 mg/dL (calc)   Total CHOL/HDL Ratio 3.3 <5.0 (calc)   Non-HDL Cholesterol (Calc) 118 <130 mg/dL (calc)       Assessment & Plan:     ICD-10-CM   1. Upper respiratory tract infection, unspecified type  J06.9 benzonatate (TESSALON) 100 MG capsule   sx onset weeks ago with uncontrolled allergies as well, restart meds above, steroid nose sprays, can try tessalon, mucinex OTC    2. Subacute cough  R05.2 DG Chest 2 View   prednisone, tessalon, mucinex recommended, f/up and do CXR if any worsening    Stop the omeprazole and do 2+ weeks of pantoprazole (gerd not well controlled and trial of stronger meds to protect stomach while doing steroids)  Do the daily nose spray allergy meds, mucinex and tessalon with starting steroids Hold the  antibiotic for now - hope to see him improve in a few days, but if he has continued sx or acute worsening with several weeks of sx, abx would be appropriate - and to pt I recommended starting abx if sudden facial or sinus pain and fever start.  F/up as needed     [name], PA-C 01/01/23 9:59 AM

## 2023-01-17 ENCOUNTER — Encounter: Payer: Medicare PPO | Admitting: Family Medicine

## 2023-01-24 ENCOUNTER — Encounter: Payer: Medicare PPO | Admitting: Family Medicine

## 2023-01-31 ENCOUNTER — Encounter: Payer: Medicare PPO | Admitting: Family Medicine

## 2023-02-05 NOTE — Progress Notes (Unsigned)
Name: Tristan Miller   MRN: [id number]    DOB: 1956/01/30   Date:02/07/2023       Progress Note  Subjective  Chief Complaint  Chief Complaint  Patient presents with   Annual Exam    HPI  Patient presents for annual CPE .  IPSS Questionnaire (AUA-7): Over the past month.   1)  How often have you had a sensation of not emptying your bladder completely after you finish urinating?  0 - Not at all  2)  How often have you had to urinate again less than two hours after you finished urinating? 0 - Not at all  3)  How often have you found you stopped and started again several times when you urinated?  0 - Not at all  4) How difficult have you found it to postpone urination?  0 - Not at all  5) How often have you had a weak urinary stream?  0 - Not at all  6) How often have you had to push or strain to begin urination?  0 - Not at all  7) How many times did you most typically get up to urinate from the time you went to bed until the time you got up in the morning?  0 - None  Total score:  0-7 mildly symptomatic   8-19 moderately symptomatic   20-35 severely symptomatic     Diet: Regular, tries to eat well balanced, but eats what he wants Exercise: walking , recommend 150 min of physical activity weekly   Last Dental Exam: June 2024 Last Eye Exam:2023  Depression: phq 9 is negative    02/07/2023   10:35 AM 01/01/2023    9:24 AM 06/13/2022    2:13 PM 10/26/2021   11:30 AM 04/26/2021   10:17 AM  Depression screen PHQ 2/9  Decreased Interest 0 0 0 0 0  Down, Depressed, Hopeless 0 0 0 0 0  PHQ - 2 Score 0 0 0 0 0  Altered sleeping  0 0 0 0  Tired, decreased energy  0 0 0 0  Change in appetite  0 0 0 0  Feeling bad or failure about yourself   0 0 0 0  Trouble concentrating  0 0 0 0  Moving slowly or fidgety/restless  0 0 0 0  Suicidal thoughts  0 0 0 0  PHQ-9 Score  0 0 0 0  Difficult doing work/chores  Not difficult at all Not difficult at all  Not difficult at all     Hypertension:  BP Readings from Last 3 Encounters:  02/07/23 118/74  01/01/23 134/80  06/13/22 130/80    Obesity: Wt Readings from Last 3 Encounters:  02/07/23 261 lb 12.8 oz (118.8 kg)  01/01/23 268 lb (121.6 kg)  06/13/22 266 lb 3.2 oz (120.7 kg)   BMI Readings from Last 3 Encounters:  02/07/23 36.51 kg/m  01/01/23 37.38 kg/m  06/13/22 37.13 kg/m     Lipids:  Lab Results  Component Value Date   CHOL 169 06/13/2022   CHOL 152 10/26/2021   CHOL 194 10/25/2020   Lab Results  Component Value Date   HDL 51 06/13/2022   HDL 45 10/26/2021   HDL 44 10/25/2020   Lab Results  Component Value Date   LDLCALC 93 06/13/2022   LDLCALC 93 10/26/2021   LDLCALC 131 (H) 10/25/2020   Lab Results  Component Value Date   TRIG 150 (H) 06/13/2022   TRIG 58 10/26/2021  TRIG 88 10/25/2020   Lab Results  Component Value Date   CHOLHDL 3.3 06/13/2022   CHOLHDL 3.4 10/26/2021   CHOLHDL 4.4 10/25/2020   Lab Results  Component Value Date   LDLDIRECT 113 (H) 04/26/2021   Glucose:  Glucose, Bld  Date Value Ref Range Status  06/13/2022 102 (H) 65 - 99 mg/dL Final    Comment:    .            Fasting reference interval . For someone without known diabetes, a glucose value between 100 and 125 mg/dL is consistent with prediabetes and should be confirmed with a follow-up test. .   10/26/2021 101 (H) 65 - 99 mg/dL Final    Comment:    .            Fasting reference interval . For someone without known diabetes, a glucose value between 100 and 125 mg/dL is consistent with prediabetes and should be confirmed with a follow-up test. .   04/26/2021 103 (H) 65 - 99 mg/dL Final    Comment:    .            Fasting reference interval . For someone without known diabetes, a glucose value between 100 and 125 mg/dL is consistent with prediabetes and should be confirmed with a follow-up test. .     Flowsheet Row Office Visit from [facility]/07/20[facility] in [facility]  AUDIT-C Score 0        Married STD testing and prevention (HIV/chl/gon/syphilis):  no completed Hep C Screening: completed Skin cancer: Discussed monitoring for atypical lesions Colorectal cancer: Cologuard scheduled Prostate cancer:  no Lab Results  Component Value Date   PSA 1.3 07/17/2016     Lung cancer:  Low Dose CT Chest recommended if Age 51-80 years, 30 pack-year currently smoking OR have quit w/in 15years. Patient  no a candidate for screening   AAA: The USPSTF recommends one-time screening with ultrasonography in men ages 16 to 75 years who have ever smoked. Patient   no, a candidate for screening  ECG:  none  Vaccines:  HPV: up to at age 54 , ask insurance if age between 32-45  Shingrix: 80-64 yo and ask insurance if covered when patient above 61 yo, needs 2nd shot Pneumonia:  educated and discussed with patient. Flu:  educated and discussed with patient.   RSV: discussed with patient  Advanced Care Planning: A voluntary discussion about advance care planning including the explanation and discussion of advance directives.  Discussed health care proxy and Living will, and the patient was able to identify a health care proxy as wife.  Patient does not have a living will and power of attorney of health care   Patient Active Problem List   Diagnosis Date Noted   Gastroesophageal reflux disease 10/26/2021   Umbilical hernia without obstruction and without gangrene 06/09/2020   Osteoarthritis of right knee 06/09/2020   Class 2 severe obesity with serious comorbidity and body mass index (BMI) of 37.0 to 37.9 in adult [facility]) 05/14/2018   Prediabetes 04/17/2016   OSA (obstructive sleep apnea) 05/02/2015   Essential hypertension 05/02/2015   Hyperlipidemia 05/02/2015    Past Surgical History:  Procedure Laterality Date   NO PAST SURGERIES      No family history on file.  Social History   Socioeconomic History   Marital status: Married    Spouse  name: Not on file   Number of children: Not on file  Years of education: Not on file   Highest education level: Not on file  Occupational History   Not on file  Tobacco Use   Smoking status: Never   Smokeless tobacco: Never  Vaping Use   Vaping status: Never Used  Substance and Sexual Activity   Alcohol use: No    Alcohol/week: 0.0 standard drinks of alcohol    Comment: quit 30 years ago   Drug use: No   Sexual activity: Yes    Partners: Female  Other Topics Concern   Not on file  Social History Narrative   Not on file   Social Determinants of Health   Financial Resource Strain: Low Risk  (02/07/2023)   Overall Financial Resource Strain (CARDIA)    Difficulty of Paying Living Expenses: Not hard at all  Food Insecurity: No Food Insecurity (02/07/2023)   Hunger Vital Sign    Worried About Running Out of Food in the Last Year: Never true    Ran Out of Food in the Last Year: Never true  Transportation Needs: No Transportation Needs (02/07/2023)   PRAPARE - Administrator, Civil Service (Medical): No    Lack of Transportation (Non-Medical): No  Physical Activity: Inactive (02/07/2023)   Exercise Vital Sign    Days of Exercise per Week: 0 days    Minutes of Exercise per Session: 0 min  Stress: No Stress Concern Present (02/07/2023)   [employer] of Occupational Health - Occupational Stress Questionnaire    Feeling of Stress : Only a little  Social Connections: Socially Integrated (02/07/2023)   Social Connection and Isolation Panel [NHANES]    Frequency of Communication with Friends and Family: More than three times a week    Frequency of Social Gatherings with Friends and Family: More than three times a week    Attends Religious Services: More than 4 times per year    Active Member of [employer] or Organizations: Yes    Attends Engineer, structural: More than 4 times per year    Marital Status: Married  Catering manager Violence: Not At Risk (02/07/2023)    Humiliation, Afraid, Rape, and Kick questionnaire    Fear of Current or Ex-Partner: No    Emotionally Abused: No    Physically Abused: No    Sexually Abused: No     Current Outpatient Medications:    olmesartan-hydrochlorothiazide (BENICAR HCT) 40-25 MG tablet, TAKE 1 TABLET BY MOUTH EVERY DAY, Disp: 30 tablet, Rfl: 1   pantoprazole (PROTONIX) 40 MG tablet, Take 1 tablet (40 mg total) by mouth daily., Disp: 30 tablet, Rfl: 3   rosuvastatin (CRESTOR) 10 MG tablet, Take 1 tablet (10 mg total) by mouth daily., Disp: 90 tablet, Rfl: 1  No Known Allergies   ROS  Constitutional: Negative for fever or weight change.  Respiratory: Negative for cough and shortness of breath.   Cardiovascular: Negative for chest pain or palpitations.  Gastrointestinal: Negative for abdominal pain, no bowel changes.  Musculoskeletal: Negative for gait problem or joint swelling.  Skin: Negative for rash.  Neurological: Negative for dizziness or headache.  No other specific complaints in a complete review of systems (except as listed in HPI above).    Objective  Vitals:   02/07/23 1035  BP: 118/74  Pulse: 78  Resp: 16  Temp: 98 F (36.7 C)  TempSrc: Oral  SpO2: 98%  Weight: 261 lb 12.8 oz (118.8 kg)  Height: 5\' 11"  (1.803 m)    Body mass index is 36.51  kg/m.  Physical Exam Constitutional: Patient appears well-developed and well-nourished. No distress.  HENT: Head: Normocephalic and atraumatic. Ears: B TMs ok, no erythema or effusion; Nose: Nose normal. Mouth/Throat: Oropharynx is clear and moist. No oropharyngeal exudate.  Eyes: Conjunctivae and EOM are normal. Pupils are equal, round, and reactive to light. No scleral icterus.  Neck: Normal range of motion. Neck supple. No JVD present. No thyromegaly present.  Cardiovascular: Normal rate, regular rhythm and normal heart sounds.  No murmur heard. No BLE edema. Pulmonary/Chest: Effort normal and breath sounds normal. No respiratory  distress. Abdominal: Soft. Bowel sounds are normal, no distension. There is no tenderness. no masses Musculoskeletal: Normal range of motion, no joint effusions. No gross deformities Neurological: he is alert and oriented to person, place, and time. No cranial nerve deficit. Coordination, balance, strength, speech and gait are normal.  Skin: Skin is warm and dry. No rash noted. No erythema.  Psychiatric: Patient has a normal mood and affect. behavior is normal. Judgment and thought content normal.   No results found for this or any previous visit (from the past 2160 hour(s)).   Fall Risk:    02/07/2023   10:34 AM 01/01/2023    9:24 AM 06/13/2022    2:13 PM 10/26/2021   11:29 AM 04/26/2021   10:14 AM  Fall Risk   Falls in the past year? 0 0 0 0 0  Number falls in past yr: 0 0 0 0 0  Injury with Fall? 0 0 0 0 0  Risk for fall due to :  No Fall Risks No Fall Risks No Fall Risks   Follow up  Falls prevention discussed;Education provided;Falls evaluation completed Falls prevention discussed;Education provided;Falls evaluation completed Falls prevention discussed      Functional Status Survey: Is the patient deaf or have difficulty hearing?: No Does the patient have difficulty seeing, even when wearing glasses/contacts?: No Does the patient have difficulty concentrating, remembering, or making decisions?: No Does the patient have difficulty walking or climbing stairs?: No Does the patient have difficulty dressing or bathing?: No Does the patient have difficulty doing errands alone such as visiting a doctor's office or shopping?: No    Assessment & Plan  1. Annual physical exam -increase physical activity -eat well balanced diet - CBC with Differential/Platelet - COMPLETE METABOLIC PANEL WITH GFR - Lipid panel - Hemoglobin A1c - PSA  2. Hyperlipidemia, unspecified hyperlipidemia type  - Lipid panel  3. Prediabetes  - COMPLETE METABOLIC PANEL WITH GFR - Hemoglobin A1c  4.  Essential hypertension  - CBC with Differential/Platelet - COMPLETE METABOLIC PANEL WITH GFR  5. Screening for colon cancer  - Cologuard  6. Screening for prostate cancer  - PSA    -Prostate cancer screening and PSA options (with potential risks and benefits of testing vs not testing) were discussed along with recent recs/guidelines. -USPSTF grade A and B recommendations reviewed with patient; age-appropriate recommendations, preventive care, screening tests, etc discussed and encouraged; healthy living encouraged; see AVS for patient education given to patient -Discussed importance of 150 minutes of physical activity weekly, eat two servings of fish weekly, eat one serving of tree nuts ( cashews, pistachios, pecans, almonds.Marland Kitchen) every other day, eat 6 servings of fruit/vegetables daily and drink plenty of water and avoid sweet beverages.  -Reviewed Health Maintenance: yes

## 2023-02-07 ENCOUNTER — Other Ambulatory Visit: Payer: Self-pay | Admitting: Family Medicine

## 2023-02-07 ENCOUNTER — Ambulatory Visit (INDEPENDENT_AMBULATORY_CARE_PROVIDER_SITE_OTHER): Payer: Medicare PPO | Admitting: Nurse Practitioner

## 2023-02-07 ENCOUNTER — Other Ambulatory Visit: Payer: Self-pay

## 2023-02-07 ENCOUNTER — Encounter: Payer: Self-pay | Admitting: Nurse Practitioner

## 2023-02-07 VITALS — BP 118/74 | HR 78 | Temp 98.0°F | Resp 16 | Ht 71.0 in | Wt 261.8 lb

## 2023-02-07 DIAGNOSIS — Z0000 Encounter for general adult medical examination without abnormal findings: Secondary | ICD-10-CM

## 2023-02-07 DIAGNOSIS — E785 Hyperlipidemia, unspecified: Secondary | ICD-10-CM

## 2023-02-07 DIAGNOSIS — R7303 Prediabetes: Secondary | ICD-10-CM | POA: Diagnosis not present

## 2023-02-07 DIAGNOSIS — Z1211 Encounter for screening for malignant neoplasm of colon: Secondary | ICD-10-CM

## 2023-02-07 DIAGNOSIS — I10 Essential (primary) hypertension: Secondary | ICD-10-CM | POA: Diagnosis not present

## 2023-02-07 DIAGNOSIS — Z125 Encounter for screening for malignant neoplasm of prostate: Secondary | ICD-10-CM

## 2023-02-07 DIAGNOSIS — Z5181 Encounter for therapeutic drug level monitoring: Secondary | ICD-10-CM

## 2023-02-07 NOTE — Telephone Encounter (Signed)
Needs f/u for bp

## 2023-02-08 LAB — CBC WITH DIFFERENTIAL/PLATELET
Absolute Monocytes: 470 {cells}/uL (ref 200–950)
Basophils Absolute: 10 {cells}/uL (ref 0–200)
Basophils Relative: 0.2 %
Eosinophils Absolute: 49 {cells}/uL (ref 15–500)
Eosinophils Relative: 1 %
HCT: 41.9 % (ref 38.5–50.0)
Hemoglobin: 13.4 g/dL (ref 13.2–17.1)
Lymphs Abs: 1578 {cells}/uL (ref 850–3900)
MCH: 26.5 pg — ABNORMAL LOW (ref 27.0–33.0)
MCHC: 32 g/dL (ref 32.0–36.0)
MCV: 82.8 fL (ref 80.0–100.0)
MPV: 11.3 fL (ref 7.5–12.5)
Monocytes Relative: 9.6 %
Neutro Abs: 2793 {cells}/uL (ref 1500–7800)
Neutrophils Relative %: 57 %
Platelets: 213 10*3/uL (ref 140–400)
RBC: 5.06 10*6/uL (ref 4.20–5.80)
RDW: 13.4 % (ref 11.0–15.0)
Total Lymphocyte: 32.2 %
WBC: 4.9 10*3/uL (ref 3.8–10.8)

## 2023-02-08 LAB — LIPID PANEL
Cholesterol: 142 mg/dL (ref <200)
HDL: 45 mg/dL (ref >=40)
LDL Cholesterol (Calc): 79 mg/dL
Non-HDL Cholesterol (Calc): 97 mg/dL (ref <130)
Total CHOL/HDL Ratio: 3.2 (calc) (ref <5.0)
Triglycerides: 101 mg/dL (ref <150)

## 2023-02-08 LAB — COMPLETE METABOLIC PANEL WITH GFR
AG Ratio: 1.5 (calc) (ref 1.0–2.5)
ALT: 13 U/L (ref 9–46)
AST: 15 U/L (ref 10–35)
Albumin: 3.8 g/dL (ref 3.6–5.1)
Alkaline phosphatase (APISO): 57 U/L (ref 35–144)
BUN: 11 mg/dL (ref 7–25)
CO2: 29 mmol/L (ref 20–32)
Calcium: 8.9 mg/dL (ref 8.6–10.3)
Chloride: 103 mmol/L (ref 98–110)
Creat: 0.84 mg/dL (ref 0.70–1.35)
Globulin: 2.6 g/dL (ref 1.9–3.7)
Glucose, Bld: 100 mg/dL — ABNORMAL HIGH (ref 65–99)
Potassium: 3.7 mmol/L (ref 3.5–5.3)
Sodium: 140 mmol/L (ref 135–146)
Total Bilirubin: 0.5 mg/dL (ref 0.2–1.2)
Total Protein: 6.4 g/dL (ref 6.1–8.1)
eGFR: 97 mL/min/{1.73_m2} (ref >=60)

## 2023-02-08 LAB — PSA: PSA: 2.77 ng/mL (ref <=4.00)

## 2023-02-08 LAB — HEMOGLOBIN A1C
Hgb A1c MFr Bld: 6.1 %{Hb} — ABNORMAL HIGH (ref <5.7)
Mean Plasma Glucose: 128 mg/dL
eAG (mmol/L): 7.1 mmol/L

## 2023-03-03 ENCOUNTER — Other Ambulatory Visit: Payer: Self-pay | Admitting: Family Medicine

## 2023-03-03 DIAGNOSIS — I10 Essential (primary) hypertension: Secondary | ICD-10-CM

## 2023-03-03 DIAGNOSIS — Z5181 Encounter for therapeutic drug level monitoring: Secondary | ICD-10-CM

## 2023-03-29 ENCOUNTER — Other Ambulatory Visit: Payer: Self-pay | Admitting: Family Medicine

## 2023-03-29 ENCOUNTER — Other Ambulatory Visit: Payer: Self-pay | Admitting: Nurse Practitioner

## 2023-03-29 NOTE — Telephone Encounter (Signed)
Requested medication (s) are due for refill today: alternative requested  Requested medication (s) are on the active medication list: yes  Last refill:  03/29/23  Future visit scheduled: yes  Notes to clinic:  Pharmacy comment: Alternative Requested.      Requested Prescriptions  Pending Prescriptions Disp Refills   pantoprazole (PROTONIX) 40 MG tablet [Pharmacy Med Name: PANTOPRAZOLE SOD DR 40 MG TAB] 90 tablet 1    Sig: TAKE 1 TABLET BY MOUTH EVERY DAY     Gastroenterology: Proton Pump Inhibitors Passed - 03/29/2023  9:22 AM      Passed - Valid encounter within last 12 months    Recent Outpatient Visits           1 month ago Annual physical exam   [facility] Berniece Salines, FNP   2 months ago Upper respiratory tract infection, unspecified type   [facility] [name], PA-C   9 months ago Rhinosinusitis   [facility] [name], PA-C   1 year ago Essential hypertension   [facility] [name], PA-C   1 year ago Essential hypertension   [facility] Caro Laroche, DO       Future Appointments             In 2 months Zane Herald, Rudolpho Sevin, FNP [facility], [facility]

## 2023-03-29 NOTE — Telephone Encounter (Signed)
PA approved.

## 2023-03-29 NOTE — Telephone Encounter (Signed)
PA done through [employer] waiting on determination

## 2023-05-25 ENCOUNTER — Other Ambulatory Visit: Payer: Self-pay | Admitting: Family Medicine

## 2023-06-11 NOTE — Progress Notes (Unsigned)
There were no vitals taken for this visit.   Subjective:    Patient ID: Tristan Miller, male    DOB: 01/07/1956, 66 y.o.   MRN: [id number]  HPI: Tristan Miller is a 67 y.o. male  No chief complaint on file.   Discussed the use of AI scribe software for clinical note transcription with the patient, who gave verbal consent to proceed.  History of Present Illness           02/07/2023   10:35 AM 01/01/2023    9:24 AM 06/13/2022    2:13 PM  Depression screen PHQ 2/9  Decreased Interest 0 0 0  Down, Depressed, Hopeless 0 0 0  PHQ - 2 Score 0 0 0  Altered sleeping  0 0  Tired, decreased energy  0 0  Change in appetite  0 0  Feeling bad or failure about yourself   0 0  Trouble concentrating  0 0  Moving slowly or fidgety/restless  0 0  Suicidal thoughts  0 0  PHQ-9 Score  0 0  Difficult doing work/chores  Not difficult at all Not difficult at all    Relevant past medical, surgical, family and social history reviewed and updated as indicated. Interim medical history since our last visit reviewed. Allergies and medications reviewed and updated.  Review of Systems  Per HPI unless specifically indicated above     Objective:    There were no vitals taken for this visit.  {Vitals History (Optional):23777} Wt Readings from Last 3 Encounters:  02/07/23 261 lb 12.8 oz (118.8 kg)  01/01/23 268 lb (121.6 kg)  06/13/22 266 lb 3.2 oz (120.7 kg)    Physical Exam  Results for orders placed or performed in visit on 02/07/23  CBC with Differential/Platelet   Collection Time: 02/07/23 11:21 AM  Result Value Ref Range   WBC 4.9 3.8 - 10.8 Thousand/uL   RBC 5.06 4.20 - 5.80 Million/uL   Hemoglobin 13.4 13.2 - 17.1 g/dL   HCT 40.9 81.1 - 91.4 %   MCV 82.8 80.0 - 100.0 fL   MCH 26.5 (L) 27.0 - 33.0 pg   MCHC 32.0 32.0 - 36.0 g/dL   RDW 78.2 95.6 - 21.3 %   Platelets 213 140 - 400 Thousand/uL   MPV 11.3 7.5 - 12.5 fL   Neutro Abs 2,793 1,500 - 7,800 cells/uL   Lymphs Abs  1,578 850 - 3,900 cells/uL   Absolute Monocytes 470 200 - 950 cells/uL   Eosinophils Absolute 49 15 - 500 cells/uL   Basophils Absolute 10 0 - 200 cells/uL   Neutrophils Relative % 57 %   Total Lymphocyte 32.2 %   Monocytes Relative 9.6 %   Eosinophils Relative 1.0 %   Basophils Relative 0.2 %  COMPLETE METABOLIC PANEL WITH GFR   Collection Time: 02/07/23 11:21 AM  Result Value Ref Range   Glucose, Bld 100 (H) 65 - 99 mg/dL   BUN 11 7 - 25 mg/dL   Creat 0.86 5.78 - 4.69 mg/dL   eGFR 97 > OR = 60 GE/XBM/8.41L2   BUN/Creatinine Ratio SEE NOTE: 6 - 22 (calc)   Sodium 140 135 - 146 mmol/L   Potassium 3.7 3.5 - 5.3 mmol/L   Chloride 103 98 - 110 mmol/L   CO2 29 20 - 32 mmol/L   Calcium 8.9 8.6 - 10.3 mg/dL   Total Protein 6.4 6.1 - 8.1 g/dL   Albumin 3.8 3.6 - 5.1 g/dL   Globulin  2.6 1.9 - 3.7 g/dL (calc)   AG Ratio 1.5 1.0 - 2.5 (calc)   Total Bilirubin 0.5 0.2 - 1.2 mg/dL   Alkaline phosphatase (APISO) 57 35 - 144 U/L   AST 15 10 - 35 U/L   ALT 13 9 - 46 U/L  Lipid panel   Collection Time: 02/07/23 11:21 AM  Result Value Ref Range   Cholesterol 142 <200 mg/dL   HDL 45 > OR = 40 mg/dL   Triglycerides 161 <096 mg/dL   LDL Cholesterol (Calc) 79 mg/dL (calc)   Total CHOL/HDL Ratio 3.2 <5.0 (calc)   Non-HDL Cholesterol (Calc) 97 <045 mg/dL (calc)  Hemoglobin W0J   Collection Time: 02/07/23 11:21 AM  Result Value Ref Range   Hgb A1c MFr Bld 6.1 (H) <5.7 % of total Hgb   Mean Plasma Glucose 128 mg/dL   eAG (mmol/L) 7.1 mmol/L  PSA   Collection Time: 02/07/23 11:21 AM  Result Value Ref Range   PSA 2.77 < OR = 4.00 ng/mL   {Labs (Optional):23779}    Assessment & Plan:   Problem List Items Addressed This Visit   None    Assessment and Plan             Follow up plan: No follow-ups on file.

## 2023-06-12 ENCOUNTER — Ambulatory Visit: Payer: Medicare PPO | Admitting: Nurse Practitioner

## 2023-06-12 ENCOUNTER — Encounter: Payer: Self-pay | Admitting: Nurse Practitioner

## 2023-06-12 VITALS — BP 158/70 | HR 78 | Temp 97.8°F | Resp 18 | Ht 71.0 in | Wt 266.7 lb

## 2023-06-12 DIAGNOSIS — E785 Hyperlipidemia, unspecified: Secondary | ICD-10-CM | POA: Diagnosis not present

## 2023-06-12 DIAGNOSIS — I10 Essential (primary) hypertension: Secondary | ICD-10-CM | POA: Diagnosis not present

## 2023-06-12 DIAGNOSIS — K219 Gastro-esophageal reflux disease without esophagitis: Secondary | ICD-10-CM | POA: Diagnosis not present

## 2023-06-12 DIAGNOSIS — Z6837 Body mass index (BMI) 37.0-37.9, adult: Secondary | ICD-10-CM

## 2023-06-12 DIAGNOSIS — E66812 Obesity, class 2: Secondary | ICD-10-CM

## 2023-06-12 DIAGNOSIS — R7303 Prediabetes: Secondary | ICD-10-CM

## 2023-06-12 DIAGNOSIS — G4733 Obstructive sleep apnea (adult) (pediatric): Secondary | ICD-10-CM

## 2023-06-12 NOTE — Assessment & Plan Note (Signed)
Comorbidities include HTN, HLD,GERD, OSA, prediabetes

## 2023-08-22 ENCOUNTER — Other Ambulatory Visit: Payer: Self-pay | Admitting: Family Medicine

## 2023-08-22 DIAGNOSIS — E785 Hyperlipidemia, unspecified: Secondary | ICD-10-CM

## 2023-10-14 ENCOUNTER — Ambulatory Visit: Payer: Self-pay | Admitting: Nurse Practitioner

## 2023-10-17 ENCOUNTER — Encounter: Payer: Self-pay | Admitting: Family Medicine

## 2023-10-17 ENCOUNTER — Other Ambulatory Visit: Payer: Self-pay

## 2023-10-17 ENCOUNTER — Telehealth: Payer: Self-pay | Admitting: Family Medicine

## 2023-10-17 ENCOUNTER — Ambulatory Visit: Admitting: Family Medicine

## 2023-10-17 VITALS — BP 124/76 | HR 68 | Temp 97.9°F | Resp 16 | Ht 71.0 in | Wt 256.7 lb

## 2023-10-17 DIAGNOSIS — E66812 Obesity, class 2: Secondary | ICD-10-CM

## 2023-10-17 DIAGNOSIS — G4733 Obstructive sleep apnea (adult) (pediatric): Secondary | ICD-10-CM | POA: Diagnosis not present

## 2023-10-17 DIAGNOSIS — Z1211 Encounter for screening for malignant neoplasm of colon: Secondary | ICD-10-CM

## 2023-10-17 DIAGNOSIS — R7303 Prediabetes: Secondary | ICD-10-CM

## 2023-10-17 DIAGNOSIS — I10 Essential (primary) hypertension: Secondary | ICD-10-CM | POA: Diagnosis not present

## 2023-10-17 DIAGNOSIS — Z6837 Body mass index (BMI) 37.0-37.9, adult: Secondary | ICD-10-CM

## 2023-10-17 DIAGNOSIS — E785 Hyperlipidemia, unspecified: Secondary | ICD-10-CM | POA: Diagnosis not present

## 2023-10-17 DIAGNOSIS — M1711 Unilateral primary osteoarthritis, right knee: Secondary | ICD-10-CM

## 2023-10-17 DIAGNOSIS — K219 Gastro-esophageal reflux disease without esophagitis: Secondary | ICD-10-CM

## 2023-10-17 DIAGNOSIS — Z5181 Encounter for therapeutic drug level monitoring: Secondary | ICD-10-CM | POA: Diagnosis not present

## 2023-10-17 MED ORDER — OLMESARTAN MEDOXOMIL-HCTZ 40-25 MG PO TABS: 1.0000 | ORAL_TABLET | Freq: Every day | ORAL | 1 refills | Status: AC

## 2023-10-17 NOTE — Assessment & Plan Note (Signed)
 BP much better controlled today and at goal He endorses improved medication compliance, diet and lifestyle efforts and weight loss BP Readings from Last 3 Encounters:  10/17/23 124/76  06/12/23 (!) 158/70  02/07/23 118/74  Continue benicar  same dose, meds refilled, continue diet/lifestyle efforts

## 2023-10-17 NOTE — Progress Notes (Signed)
 Name: Tristan Miller   MRN: [id number]    DOB: 18-Jan-1956   Date:10/17/2023       Progress Note  Chief Complaint  Patient presents with   Medical Management of Chronic Issues    4 month follow up     Subjective:   Tristan Miller is a 68 y.o. male, presents to clinic for routine follow up on chronic conditions  HTN has been poorly controlled with intermittent med use and also other factors including stress. Last OV BP was elevated BP Readings from Last 3 Encounters:  10/17/23 124/76  06/12/23 (!) 158/70  02/07/23 118/74  Managed on benicar  40-25 BP better and at goal today  HLD on statin again intermittent med compliance with crestor  10 Lab Results  Component Value Date   CHOL 142 02/07/2023   HDL 45 02/07/2023   LDLCALC 79 02/07/2023   LDLDIRECT 113 (H) 04/26/2021   TRIG 101 02/07/2023   CHOLHDL 3.2 02/07/2023   OSA - not on CPAP - but he lost a lot of weight since his past sleep study esp lost 1.5-2 " in neck size in shirts and noted improvement in sleep   GERD uses prilosec prn, sx overall controlled not using prilosec in a months    Obesity: Wt Readings from Last 5 Encounters:  10/17/23 256 lb 11.2 oz (116.4 kg)  06/12/23 266 lb 11.2 oz (121 kg)  02/07/23 261 lb 12.8 oz (118.8 kg)  01/01/23 268 lb (121.6 kg)  06/13/22 266 lb 3.2 oz (120.7 kg)   BMI Readings from Last 5 Encounters:  10/17/23 35.80 kg/m  06/12/23 37.20 kg/m  02/07/23 36.51 kg/m  01/01/23 37.38 kg/m  06/13/22 37.13 kg/m   Prediabetes, not on meds A1c has been stable in prediabetic range for the past few years Lab Results  Component Value Date   HGBA1C 6.1 (H) 02/07/2023   HGBA1C 6.1 (H) 06/13/2022   HGBA1C 6.1 (H) 10/26/2021   HGBA1C 6.0 (H) 04/26/2021   HGBA1C 6.0 (H) 10/25/2020       Current Outpatient Medications:    olmesartan -hydrochlorothiazide (BENICAR  HCT) 40-25 MG tablet, TAKE 1 TABLET BY MOUTH EVERY DAY, Disp: 90 tablet, Rfl: 1   omeprazole  (PRILOSEC) 20 MG  capsule, Take 20 mg by mouth daily as needed., Disp: , Rfl:    rosuvastatin  (CRESTOR ) 10 MG tablet, TAKE 1 TABLET BY MOUTH EVERY DAY, Disp: 90 tablet, Rfl: 1  Patient Active Problem List   Diagnosis Date Noted   Gastroesophageal reflux disease 10/26/2021   Umbilical hernia without obstruction and without gangrene 06/09/2020   Osteoarthritis of right knee 06/09/2020   Class 2 severe obesity with serious comorbidity and body mass index (BMI) of 37.0 to 37.9 in adult [facility]) 05/14/2018   Prediabetes 04/17/2016   OSA (obstructive sleep apnea) 05/02/2015   Essential hypertension 05/02/2015   Hyperlipidemia 05/02/2015    Past Surgical History:  Procedure Laterality Date   NO PAST SURGERIES      History reviewed. No pertinent family history.  Social History   Tobacco Use   Smoking status: Never   Smokeless tobacco: Never  Vaping Use   Vaping status: Never Used  Substance Use Topics   Alcohol use: No    Alcohol/week: 0.0 standard drinks of alcohol    Comment: quit 30 years ago   Drug use: No     No Known Allergies  Health Maintenance  Topic Date Due   Medicare Annual Wellness (AWV)  Never done   Fecal DNA (  Cologuard)  Never done   Zoster Vaccines- Shingrix (1 of 2) Never done   COVID-19 Vaccine (4 - 2024-25 season) 11/02/2023 (Originally 02/24/2023)   Pneumonia Vaccine 48+ Years old (1 of 1 - PCV) 06/11/2024 (Originally 09/24/2007)   INFLUENZA VACCINE  01/24/2024   DTaP/Tdap/Td (2 - Td or Tdap) 04/08/2028   Hepatitis C Screening  Completed   HPV VACCINES  Aged Out   Meningococcal B Vaccine  Aged Out    Chart Review Today: I personally reviewed active problem list, medication list, allergies, family history, social history, health maintenance, notes from last encounter, lab results, imaging with the patient/caregiver today.   Review of Systems  Constitutional: Negative.   HENT: Negative.    Eyes: Negative.   Respiratory: Negative.    Cardiovascular: Negative.    Gastrointestinal: Negative.   Endocrine: Negative.   Genitourinary: Negative.   Musculoskeletal: Negative.   Skin: Negative.   Allergic/Immunologic: Negative.   Neurological: Negative.   Hematological: Negative.   Psychiatric/Behavioral: Negative.    All other systems reviewed and are negative.    Objective:   Vitals:   10/17/23 1009  BP: 124/76  Pulse: 68  Resp: 16  Temp: 97.9 F (36.6 C)  TempSrc: Oral  SpO2: 98%  Weight: 256 lb 11.2 oz (116.4 kg)  Height: 5\' 11"  (1.803 m)    Body mass index is 35.8 kg/m.  Physical Exam Vitals and nursing note reviewed.  Constitutional:      General: He is not in acute distress.    Appearance: Normal appearance. He is well-developed. He is obese. He is not ill-appearing, toxic-appearing or diaphoretic.  HENT:     Head: Normocephalic and atraumatic.     Nose: Nose normal.  Eyes:     General:        Right eye: No discharge.        Left eye: No discharge.     Conjunctiva/sclera: Conjunctivae normal.  Neck:     Trachea: No tracheal deviation.  Cardiovascular:     Rate and Rhythm: Normal rate and regular rhythm.     Pulses: Normal pulses.     Heart sounds: Normal heart sounds. No murmur heard.    No friction rub. No gallop.  Pulmonary:     Effort: Pulmonary effort is normal. No respiratory distress.     Breath sounds: Normal breath sounds. No stridor. No wheezing, rhonchi or rales.  Musculoskeletal:     Right lower leg: No edema.     Left lower leg: No edema.  Skin:    General: Skin is warm and dry.     Findings: No rash.  Neurological:     Mental Status: He is alert.     Motor: No abnormal muscle tone.     Coordination: Coordination normal.     Gait: Gait normal.  Psychiatric:        Mood and Affect: Mood normal.        Behavior: Behavior normal.     Is the patient deaf or have difficulty hearing?: No Does the patient have difficulty seeing, even when wearing glasses/contacts?: No Does the patient have difficulty  concentrating, remembering, or making decisions?: No Does the patient have difficulty walking or climbing stairs?: No Does the patient have difficulty dressing or bathing?: No Does the patient have difficulty doing errands alone such as visiting a doctor's office or shopping?: No Results for orders placed or performed in visit on 02/07/23  CBC with Differential/Platelet   Collection Time: 02/07/23 11:21  AM  Result Value Ref Range   WBC 4.9 3.8 - 10.8 Thousand/uL   RBC 5.06 4.20 - 5.80 Million/uL   Hemoglobin 13.4 13.2 - 17.1 g/dL   HCT 16.1 09.6 - 04.5 %   MCV 82.8 80.0 - 100.0 fL   MCH 26.5 (L) 27.0 - 33.0 pg   MCHC 32.0 32.0 - 36.0 g/dL   RDW 40.9 81.1 - 91.4 %   Platelets 213 140 - 400 Thousand/uL   MPV 11.3 7.5 - 12.5 fL   Neutro Abs 2,793 1,500 - 7,800 cells/uL   Lymphs Abs 1,578 850 - 3,900 cells/uL   Absolute Monocytes 470 200 - 950 cells/uL   Eosinophils Absolute 49 15 - 500 cells/uL   Basophils Absolute 10 0 - 200 cells/uL   Neutrophils Relative % 57 %   Total Lymphocyte 32.2 %   Monocytes Relative 9.6 %   Eosinophils Relative 1.0 %   Basophils Relative 0.2 %  COMPLETE METABOLIC PANEL WITH GFR   Collection Time: 02/07/23 11:21 AM  Result Value Ref Range   Glucose, Bld 100 (H) 65 - 99 mg/dL   BUN 11 7 - 25 mg/dL   Creat 7.82 9.56 - 2.13 mg/dL   eGFR 97 > OR = 60 YQ/MVH/[id number]   BUN/Creatinine Ratio SEE NOTE: 6 - 22 (calc)   Sodium 140 135 - 146 mmol/L   Potassium 3.7 3.5 - 5.3 mmol/L   Chloride 103 98 - 110 mmol/L   CO2 29 20 - 32 mmol/L   Calcium  8.9 8.6 - 10.3 mg/dL   Total Protein 6.4 6.1 - 8.1 g/dL   Albumin 3.8 3.6 - 5.1 g/dL   Globulin 2.6 1.9 - 3.7 g/dL (calc)   AG Ratio 1.5 1.0 - 2.5 (calc)   Total Bilirubin 0.5 0.2 - 1.2 mg/dL   Alkaline phosphatase (APISO) 57 35 - 144 U/L   AST 15 10 - 35 U/L   ALT 13 9 - 46 U/L  Lipid panel   Collection Time: 02/07/23 11:21 AM  Result Value Ref Range   Cholesterol 142 <200 mg/dL   HDL 45 > OR = 40 mg/dL    Triglycerides 295 <284 mg/dL   LDL Cholesterol (Calc) 79 mg/dL (calc)   Total CHOL/HDL Ratio 3.2 <5.0 (calc)   Non-HDL Cholesterol (Calc) 97 <132 mg/dL (calc)  Hemoglobin G4W   Collection Time: 02/07/23 11:21 AM  Result Value Ref Range   Hgb A1c MFr Bld 6.1 (H) <5.7 % of total Hgb   Mean Plasma Glucose 128 mg/dL   eAG (mmol/L) 7.1 mmol/L  PSA   Collection Time: 02/07/23 11:21 AM  Result Value Ref Range   PSA 2.77 < OR = 4.00 ng/mL      Assessment & Plan:   OSA (obstructive sleep apnea) Assessment & Plan: Prior dx and sleep study (can see 2016 with Dr. Roney Coffin) never did CPAP States neck size and sx improved with weight loss and diet changes No excessive sleepiness or concerns at this time   Essential hypertension Assessment & Plan: BP much better controlled today and at goal He endorses improved medication compliance, diet and lifestyle efforts and weight loss BP Readings from Last 3 Encounters:  10/17/23 124/76  06/12/23 (!) 158/70  02/07/23 118/74  Continue benicar  same dose, meds refilled, continue diet/lifestyle efforts   Orders: -     Olmesartan  Medoxomil-HCTZ; Take 1 tablet by mouth daily.  Dispense: 90 tablet; Refill: 1 -     Comprehensive metabolic panel with GFR  Encounter  for medication monitoring -     Comprehensive metabolic panel with GFR -     Hemoglobin A1c -     Lipid panel  Hyperlipidemia, unspecified hyperlipidemia type Assessment & Plan: He endorses better daily statin compliance and no SE or concerns Check labs with this improved compliance to see LDL effect of statin Labs today Continue diet/lifestyle efforts and per results we will refill or adjust statin   Orders: -     Comprehensive metabolic panel with GFR -     Lipid panel  Prediabetes Assessment & Plan: Stable prediabetes for years- labs reviewed Lab Results  Component Value Date   HGBA1C 6.1 (H) 02/07/2023   HGBA1C 6.1 (H) 06/13/2022   HGBA1C 6.1 (H) 10/26/2021   HGBA1C  6.0 (H) 04/26/2021   HGBA1C 6.0 (H) 10/25/2020  He has lost about 10 lbs in the past 4 months and changed his diet by significantly reducing portions Recheck A1c today and pending results can monitor every 6 to 12 months   Orders: -     Hemoglobin A1c  Gastroesophageal reflux disease, unspecified whether esophagitis present Assessment & Plan: Improve reflux with weight loss and diet changes he is rarely using any medications now x several months   Class 2 severe obesity with serious comorbidity and body mass index (BMI) of 37.0 to 37.9 in adult, unspecified obesity type [facility]) Assessment & Plan: He has worked on improving diet and nutrition, decreased portions, lost about 10 pounds BMI continues to be in obese category with comorbidities including osteoarthritis, hypertension, prediabetes, hyperlipidemia, obstructive sleep apnea Wt Readings from Last 5 Encounters:  10/17/23 256 lb 11.2 oz (116.4 kg)  06/12/23 266 lb 11.2 oz (121 kg)  02/07/23 261 lb 12.8 oz (118.8 kg)  01/01/23 268 lb (121.6 kg)  06/13/22 266 lb 3.2 oz (120.7 kg)   BMI Readings from Last 5 Encounters:  10/17/23 35.80 kg/m  06/12/23 37.20 kg/m  02/07/23 36.51 kg/m  01/01/23 37.38 kg/m  06/13/22 37.13 kg/m  Pt encouraged to continue his diet and lifestyle efforts    Osteoarthritis of right knee, unspecified osteoarthritis type Assessment & Plan: Stable chronic sx   Screening for colon cancer -     Cologuard     Return for needs MWV or CPE per his preference in next 6 months.   Adeline Hone, PA-C 10/17/23 10:14 AM

## 2023-10-17 NOTE — Telephone Encounter (Signed)
 Please sch awv

## 2023-10-17 NOTE — Assessment & Plan Note (Signed)
 Stable prediabetes for years- labs reviewed Lab Results  Component Value Date   HGBA1C 6.1 (H) 02/07/2023   HGBA1C 6.1 (H) 06/13/2022   HGBA1C 6.1 (H) 10/26/2021   HGBA1C 6.0 (H) 04/26/2021   HGBA1C 6.0 (H) 10/25/2020  He has lost about 10 lbs in the past 4 months and changed his diet by significantly reducing portions Recheck A1c today and pending results can monitor every 6 to 12 months

## 2023-10-17 NOTE — Assessment & Plan Note (Signed)
 Stable chronic sx

## 2023-10-17 NOTE — Assessment & Plan Note (Signed)
 Improve reflux with weight loss and diet changes he is rarely using any medications now x several months

## 2023-10-17 NOTE — Assessment & Plan Note (Signed)
 Prior dx and sleep study (can see 2016 with Dr. Roney Coffin) never did CPAP States neck size and sx improved with weight loss and diet changes No excessive sleepiness or concerns at this time

## 2023-10-17 NOTE — Assessment & Plan Note (Signed)
 He endorses better daily statin compliance and no SE or concerns Check labs with this improved compliance to see LDL effect of statin Labs today Continue diet/lifestyle efforts and per results we will refill or adjust statin

## 2023-10-17 NOTE — Assessment & Plan Note (Signed)
 He has worked on [employer] and nutrition, decreased portions, lost about 10 pounds BMI continues to be in obese category with comorbidities including osteoarthritis, hypertension, prediabetes, hyperlipidemia, obstructive sleep apnea Wt Readings from Last 5 Encounters:  10/17/23 256 lb 11.2 oz (116.4 kg)  06/12/23 266 lb 11.2 oz (121 kg)  02/07/23 261 lb 12.8 oz (118.8 kg)  01/01/23 268 lb (121.6 kg)  06/13/22 266 lb 3.2 oz (120.7 kg)   BMI Readings from Last 5 Encounters:  10/17/23 35.80 kg/m  06/12/23 37.20 kg/m  02/07/23 36.51 kg/m  01/01/23 37.38 kg/m  06/13/22 37.13 kg/m  Pt encouraged to continue his diet and lifestyle efforts

## 2023-10-18 ENCOUNTER — Encounter: Payer: Self-pay | Admitting: Family Medicine

## 2023-10-18 LAB — COMPREHENSIVE METABOLIC PANEL WITH GFR
AG Ratio: 1.4 (calc) (ref 1.0–2.5)
ALT: 17 U/L (ref 9–46)
AST: 17 U/L (ref 10–35)
Albumin: 3.8 g/dL (ref 3.6–5.1)
Alkaline phosphatase (APISO): 61 U/L (ref 35–144)
BUN: 12 mg/dL (ref 7–25)
CO2: 27 mmol/L (ref 20–32)
Calcium: 9.2 mg/dL (ref 8.6–10.3)
Chloride: 103 mmol/L (ref 98–110)
Creat: 1.02 mg/dL (ref 0.70–1.35)
Globulin: 2.7 g/dL (ref 1.9–3.7)
Glucose, Bld: 103 mg/dL — ABNORMAL HIGH (ref 65–99)
Potassium: 3.9 mmol/L (ref 3.5–5.3)
Sodium: 141 mmol/L (ref 135–146)
Total Bilirubin: 0.3 mg/dL (ref 0.2–1.2)
Total Protein: 6.5 g/dL (ref 6.1–8.1)
eGFR: 81 mL/min/{1.73_m2} (ref >=60)

## 2023-10-18 LAB — HEMOGLOBIN A1C
Hgb A1c MFr Bld: 6.2 % — ABNORMAL HIGH (ref <5.7)
Mean Plasma Glucose: 131 mg/dL
eAG (mmol/L): 7.3 mmol/L

## 2023-10-18 LAB — LIPID PANEL
Cholesterol: 141 mg/dL (ref <200)
HDL: 50 mg/dL (ref >=40)
LDL Cholesterol (Calc): 78 mg/dL
Non-HDL Cholesterol (Calc): 91 mg/dL (ref <130)
Total CHOL/HDL Ratio: 2.8 (calc) (ref <5.0)
Triglycerides: 52 mg/dL (ref <150)

## 2024-01-24 ENCOUNTER — Ambulatory Visit

## 2024-01-31 ENCOUNTER — Ambulatory Visit

## 2024-01-31 VITALS — BP 124/76 | Ht 71.0 in | Wt 260.0 lb

## 2024-01-31 DIAGNOSIS — Z0000 Encounter for general adult medical examination without abnormal findings: Secondary | ICD-10-CM

## 2024-01-31 NOTE — Progress Notes (Signed)
 Because this visit was a virtual/telehealth visit,  certain criteria was not obtained, such a blood pressure, CBG if applicable, and timed get up and go. Any medications not marked as taking were not mentioned during the medication reconciliation part of the visit. Any vitals not documented were not able to be obtained due to this being a telehealth visit or patient was unable to self-report a recent blood pressure reading due to a lack of equipment at home via telehealth. Vitals that have been documented are verbally provided by the patient.   This visit was performed by a medical professional under my direct supervision. I was immediately available for consultation/collaboration. I have reviewed and agree with the Annual Wellness Visit documentation.  Subjective:   Tristan Miller is a 68 y.o. who presents for a Medicare Wellness preventive visit.  As a reminder, Annual Wellness Visits don't include a physical exam, and some assessments may be limited, especially if this visit is performed virtually. We may recommend an in-person follow-up visit with your provider if needed.  Visit Complete: Virtual I connected with  Tristan Miller on 01/31/24 by a audio enabled telemedicine application and verified that I am speaking with the correct person using two identifiers.  Patient Location: Home  Provider Location: Home Office  I discussed the limitations of evaluation and management by telemedicine. The patient expressed understanding and agreed to proceed.  Vital Signs: Because this visit was a virtual/telehealth visit, some criteria may be missing or patient reported. Any vitals not documented were not able to be obtained and vitals that have been documented are patient reported.  VideoDeclined- This patient declined Librarian, academic. Therefore the visit was completed with audio only.  Persons Participating in Visit: Patient.  AWV Questionnaire: No: Patient  Medicare AWV questionnaire was not completed prior to this visit.  Cardiac Risk Factors include: advanced age (>41men, >49 women);male gender;obesity (BMI >30kg/m2);dyslipidemia;hypertension     Objective:    Today's Vitals   01/31/24 0904  BP: 124/76  Weight: 260 lb (117.9 kg)  Height: 5' 11 (1.803 m)   Body mass index is 36.26 kg/m.     01/31/2024    9:08 AM 11/09/2016   11:59 AM 10/31/2016    9:51 AM 07/17/2016   11:34 AM 04/17/2016    9:43 AM 05/18/2015   11:58 AM  Advanced Directives  Does Patient Have a Medical Advance Directive? No No  No  No  No  No   Would patient like information on creating a medical advance directive? No - Patient declined    No - patient declined information       Data saved with a previous flowsheet row definition    Current Medications (verified) Outpatient Encounter Medications as of 01/31/2024  Medication Sig   olmesartan -hydrochlorothiazide (BENICAR  HCT) 40-25 MG tablet Take 1 tablet by mouth daily.   omeprazole  (PRILOSEC) 20 MG capsule Take 20 mg by mouth daily as needed.   rosuvastatin  (CRESTOR ) 10 MG tablet TAKE 1 TABLET BY MOUTH EVERY DAY   No facility-administered encounter medications on file as of 01/31/2024.    Allergies (verified) Patient has no known allergies.   History: Past Medical History:  Diagnosis Date   Hyperlipidemia    Hypertension    Past Surgical History:  Procedure Laterality Date   NO PAST SURGERIES     History reviewed. No pertinent family history. Social History   Socioeconomic History   Marital status: Married    Spouse name: Not on  file   Number of children: Not on file   Years of education: Not on file   Highest education level: Not on file  Occupational History   Not on file  Tobacco Use   Smoking status: Never   Smokeless tobacco: Never  Vaping Use   Vaping status: Never Used  Substance and Sexual Activity   Alcohol use: No    Alcohol/week: 0.0 standard drinks of alcohol    Comment: quit  30 years ago   Drug use: No   Sexual activity: Yes    Partners: Female  Other Topics Concern   Not on file  Social History Narrative   Not on file   Social Drivers of Health   Financial Resource Strain: Low Risk  (01/31/2024)   Overall Financial Resource Strain (CARDIA)    Difficulty of Paying Living Expenses: Not hard at all  Food Insecurity: No Food Insecurity (01/31/2024)   Hunger Vital Sign    Worried About Running Out of Food in the Last Year: Never true    Ran Out of Food in the Last Year: Never true  Transportation Needs: No Transportation Needs (01/31/2024)   PRAPARE - Administrator, Civil Service (Medical): No    Lack of Transportation (Non-Medical): No  Physical Activity: Sufficiently Active (01/31/2024)   Exercise Vital Sign    Days of Exercise per Week: 7 days    Minutes of Exercise per Session: 60 min  Stress: No Stress Concern Present (01/31/2024)   [employer] of Occupational Health - Occupational Stress Questionnaire    Feeling of Stress: Only a little  Social Connections: Socially Integrated (01/31/2024)   Social Connection and Isolation Panel    Frequency of Communication with Friends and Family: More than three times a week    Frequency of Social Gatherings with Friends and Family: More than three times a week    Attends Religious Services: More than 4 times per year    Active Member of [employer] or Organizations: Yes    Attends Engineer, structural: More than 4 times per year    Marital Status: Married    Tobacco Counseling Counseling given: Not Answered    Clinical Intake:  Pre-visit preparation completed: Yes  Pain : No/denies pain     BMI - recorded: 36.26 Nutritional Status: BMI > 30  Obese Nutritional Risks: None Diabetes: No  Lab Results  Component Value Date   HGBA1C 6.2 (H) 10/17/2023   HGBA1C 6.1 (H) 02/07/2023   HGBA1C 6.1 (H) 06/13/2022     How often do you need to have someone help you when you read  instructions, pamphlets, or other written materials from your doctor or pharmacy?: 1 - Never  Interpreter Needed?: No  Information entered by :: [employer]   Activities of Daily Living     01/31/2024    9:07 AM 10/17/2023   10:10 AM  In your present state of health, do you have any difficulty performing the following activities:  Hearing? 0 0  Vision? 0 0  Difficulty concentrating or making decisions? 0 0  Walking or climbing stairs? 0 0  Dressing or bathing? 0 0  Doing errands, shopping? 0 0  Preparing Food and eating ? N   Using the Toilet? N   In the past six months, have you accidently leaked urine? N   Do you have problems with loss of bowel control? N   Managing your Medications? N   Managing your Finances? N  Housekeeping or managing your Housekeeping? N     Patient Care Team: Tapia, Leisa, PA-C as PCP - General (Family Medicine)  I have updated your Care Teams any recent Medical Services you may have received from other providers in the past year.     Assessment:   This is a routine wellness examination for [employer].  Hearing/Vision screen Hearing Screening - Comments:: No difficulties  Vision Screening - Comments:: Patient wears glasses    Goals Addressed             This Visit's Progress    Patient Stated       Patient would like to purchase a home       Depression Screen     01/31/2024    9:08 AM 10/17/2023   10:10 AM 06/12/2023   10:12 AM 02/07/2023   10:35 AM 01/01/2023    9:24 AM 06/13/2022    2:13 PM 10/26/2021   11:30 AM  PHQ 2/9 Scores  PHQ - 2 Score 0 0 0 0 0 0 0  PHQ- 9 Score 1  0  0 0 0    Fall Risk     01/31/2024    9:08 AM 10/17/2023   10:10 AM 06/12/2023   10:11 AM 02/07/2023   10:34 AM 01/01/2023    9:24 AM  Fall Risk   Falls in the past year? 0 0 0 0 0  Number falls in past yr: 0 0  0 0  Injury with Fall? 0 0  0 0  Risk for fall due to : No Fall Risks No Fall Risks No Fall Risks  No Fall Risks  Follow up Falls  evaluation completed Falls evaluation completed Falls prevention discussed  Falls prevention discussed;Education provided;Falls evaluation completed    MEDICARE RISK AT HOME:  Medicare Risk at Home Any stairs in or around the home?: No If so, are there any without handrails?: No Home free of loose throw rugs in walkways, pet beds, electrical cords, etc?: Yes Adequate lighting in your home to reduce risk of falls?: Yes Life alert?: No Use of a cane, walker or w/c?: No Grab bars in the bathroom?: No Shower chair or bench in shower?: No Elevated toilet seat or a handicapped toilet?: No  TIMED UP AND GO:  Was the test performed?  No  Cognitive Function: 6CIT completed        01/31/2024    9:06 AM  6CIT Screen  What Year? 0 points  What month? 0 points  What time? 0 points  Count back from 20 0 points  Months in reverse 0 points  Repeat phrase 0 points  Total Score 0 points    Immunizations Immunization History  Administered Date(s) Administered   Fluad Quad(high Dose 65+) 04/01/2023   Influenza,inj,Quad PF,6+ Mos 05/02/2015, 04/08/2018, 04/28/2019, 05/11/2020, 04/10/2021   Influenza-Unspecified 04/02/2016   PFIZER(Purple Top)SARS-COV-2 Vaccination 08/26/2019, 09/28/2019, 04/14/2020   Tdap 04/08/2018    Screening Tests Health Maintenance  Topic Date Due   Fecal DNA (Cologuard)  Never done   Zoster Vaccines- Shingrix (1 of 2) Never done   COVID-19 Vaccine (4 - 2024-25 season) 02/24/2023   INFLUENZA VACCINE  01/24/2024   Pneumococcal Vaccine: 50+ Years (1 of 1 - PCV) 06/11/2024 (Originally 09/24/2007)   Medicare Annual Wellness (AWV)  01/30/2025   DTaP/Tdap/Td (2 - Td or Tdap) 04/08/2028   Hepatitis C Screening  Completed   Hepatitis B Vaccines  Aged Out   HPV VACCINES  Aged Out  Meningococcal B Vaccine  Aged Out    Health Maintenance  Health Maintenance Due  Topic Date Due   Fecal DNA (Cologuard)  Never done   Zoster Vaccines- Shingrix (1 of 2) Never done    COVID-19 Vaccine (4 - 2024-25 season) 02/24/2023   INFLUENZA VACCINE  01/24/2024   Health Maintenance Items Addressed:   Additional Screening:  Vision Screening: Recommended annual ophthalmology exams for early detection of glaucoma and other disorders of the eye. Would you like a referral to an eye doctor? No    Dental Screening: Recommended annual dental exams for proper oral hygiene  Community Resource Referral / Chronic Care Management: CRR required this visit?  No   CCM required this visit?  No   Plan:    I have personally reviewed and noted the following in the patient's chart:   Medical and social history Use of alcohol, tobacco or illicit drugs  Current medications and supplements including opioid prescriptions. Patient is not currently taking opioid prescriptions. Functional ability and status Nutritional status Physical activity Advanced directives List of other physicians Hospitalizations, surgeries, and ER visits in previous 12 months Vitals Screenings to include cognitive, depression, and falls Referrals and appointments  In addition, I have reviewed and discussed with patient certain preventive protocols, quality metrics, and best practice recommendations. A written personalized care plan for preventive services as well as general preventive health recommendations were provided to patient.   Lyle MARLA Right, [place]   01/31/2024   After Visit Summary: (MyChart) Due to this being a telephonic visit, the after visit summary with patients personalized plan was offered to patient via MyChart   Notes: Nothing significant to report at this time.

## 2024-01-31 NOTE — Patient Instructions (Signed)
 Mr. Brodhead , Thank you for taking time out of your busy schedule to complete your Annual Wellness Visit with me. I enjoyed our conversation and look forward to speaking with you again next year. I, as well as your care team,  appreciate your ongoing commitment to your health goals. Please review the following plan we discussed and let me know if I can assist you in the future. Your Game plan/ To Do List    Referrals: If you haven't heard from the office you've been referred to, please reach out to them at the phone provided.   Follow up Visits: We will see or speak with you next year for your Next Medicare AWV with our clinical staff Have you seen your provider in the last 6 months (3 months if uncontrolled diabetes)? No  Clinician Recommendations:  Aim for 30 minutes of exercise or brisk walking, 6-8 glasses of water, and 5 servings of fruits and vegetables each day.       This is a list of the screenings recommended for you:  Health Maintenance  Topic Date Due   Cologuard (Stool DNA test)  Never done   Zoster (Shingles) Vaccine (1 of 2) Never done   COVID-19 Vaccine (4 - 2024-25 season) 02/24/2023   Flu Shot  01/24/2024   Pneumococcal Vaccine for age over 15 (1 of 1 - PCV) 06/11/2024*   Medicare Annual Wellness Visit  01/30/2025   DTaP/Tdap/Td vaccine (2 - Td or Tdap) 04/08/2028   Hepatitis C Screening  Completed   Hepatitis B Vaccine  Aged Out   HPV Vaccine  Aged Out   Meningitis B Vaccine  Aged Out  *Topic was postponed. The date shown is not the original due date.    Advanced directives: (Declined) Advance directive discussed with you today. Even though you declined this today, please call our office should you change your mind, and we can give you the proper paperwork for you to fill out. Advance Care Planning is important because it:  [x]  Makes sure you receive the medical care that is consistent with your values, goals, and preferences  [x]  It provides guidance to your  family and loved ones and reduces their decisional burden about whether or not they are making the right decisions based on your wishes.  Follow the link provided in your after visit summary or read over the paperwork we have mailed to you to help you started getting your Advance Directives in place. If you need assistance in completing these, please reach out to us  so that we can help you!  See attachments for Preventive Care and Fall Prevention Tips.

## 2024-02-11 ENCOUNTER — Ambulatory Visit (INDEPENDENT_AMBULATORY_CARE_PROVIDER_SITE_OTHER): Admitting: Family Medicine

## 2024-02-11 ENCOUNTER — Encounter: Payer: Self-pay | Admitting: Family Medicine

## 2024-02-11 VITALS — BP 124/78 | HR 69 | Resp 16 | Ht 71.0 in | Wt 260.0 lb

## 2024-02-11 DIAGNOSIS — G4733 Obstructive sleep apnea (adult) (pediatric): Secondary | ICD-10-CM | POA: Diagnosis not present

## 2024-02-11 DIAGNOSIS — Z1211 Encounter for screening for malignant neoplasm of colon: Secondary | ICD-10-CM

## 2024-02-11 DIAGNOSIS — E66812 Obesity, class 2: Secondary | ICD-10-CM | POA: Diagnosis not present

## 2024-02-11 DIAGNOSIS — E785 Hyperlipidemia, unspecified: Secondary | ICD-10-CM | POA: Diagnosis not present

## 2024-02-11 DIAGNOSIS — Z125 Encounter for screening for malignant neoplasm of prostate: Secondary | ICD-10-CM

## 2024-02-11 DIAGNOSIS — Z0001 Encounter for general adult medical examination with abnormal findings: Secondary | ICD-10-CM | POA: Diagnosis not present

## 2024-02-11 DIAGNOSIS — R21 Rash and other nonspecific skin eruption: Secondary | ICD-10-CM

## 2024-02-11 DIAGNOSIS — Z6837 Body mass index (BMI) 37.0-37.9, adult: Secondary | ICD-10-CM

## 2024-02-11 DIAGNOSIS — I10 Essential (primary) hypertension: Secondary | ICD-10-CM | POA: Diagnosis not present

## 2024-02-11 DIAGNOSIS — R7303 Prediabetes: Secondary | ICD-10-CM

## 2024-02-11 DIAGNOSIS — K219 Gastro-esophageal reflux disease without esophagitis: Secondary | ICD-10-CM

## 2024-02-11 DIAGNOSIS — M79642 Pain in left hand: Secondary | ICD-10-CM

## 2024-02-11 DIAGNOSIS — Z0000 Encounter for general adult medical examination without abnormal findings: Secondary | ICD-10-CM

## 2024-02-11 MED ORDER — ROSUVASTATIN CALCIUM 10 MG PO TABS: 10.0000 mg | ORAL_TABLET | Freq: Every day | ORAL | 1 refills | Status: AC

## 2024-02-11 NOTE — Patient Instructions (Signed)
 Health Maintenance  Topic Date Due   Cologuard (Stool DNA test)  Never done   COVID-19 Vaccine (4 - 2024-25 season) 02/26/2024*   Zoster (Shingles) Vaccine (1 of 2) 05/12/2024*   Pneumococcal Vaccine for age over 74 (1 of 1 - PCV) 06/11/2024*   Flu Shot  09/22/2024*   Medicare Annual Wellness Visit  01/30/2025   DTaP/Tdap/Td vaccine (2 - Td or Tdap) 04/08/2028   Hepatitis C Screening  Completed   HPV Vaccine  Aged Out   Meningitis B Vaccine  Aged Out  *Topic was postponed. The date shown is not the original due date.   Check with your pharmacy about getting Shingrix shots done

## 2024-02-11 NOTE — Progress Notes (Signed)
 Patient: Tristan Miller, Male    DOB: 1955-12-11, 68 y.o.   MRN: [id number] Leavy Mole, PA-C Visit Date: 02/11/2024  Today's Provider: Mole Leavy, PA-C   Chief Complaint  Patient presents with   Annual Exam   Subjective:   Annual physical exam:  Tristan Miller is a 68 y.o. male who presents today for health maintenance and annual & complete physical exam.   Exercise/Activity:  walking a [facility] Diet/nutrition:  no particular efforts  Sleep:  OSA previously no longer on CPAP - sleeps well sometimes wakes up once a night to pee with taking HCTZ at bedtime  SDOH Screenings   Food Insecurity: No Food Insecurity (01/31/2024)  Housing: Low Risk  (01/31/2024)  Transportation Needs: No Transportation Needs (01/31/2024)  Utilities: Not At Risk (01/31/2024)  Alcohol Screen: Low Risk  (01/31/2024)  Depression (PHQ2-9): Low Risk  (02/11/2024)  Financial Resource Strain: Low Risk  (01/31/2024)  Physical Activity: Sufficiently Active (01/31/2024)  Social Connections: Socially Integrated (01/31/2024)  Stress: No Stress Concern Present (01/31/2024)  Tobacco Use: Low Risk  (02/11/2024)  Health Literacy: Adequate Health Literacy (01/31/2024)    Pt wished to discuss acute complaints - left hand 4th MCP joint pain and swelling after some increased activity when grabbing heavy bags   Advised pt of separate visit billing/coding  USPSTF grade A and B recommendations - reviewed and addressed today  Depression:  Phq 9 completed today by patient, was reviewed by me with patient in the room, score is  negative, pt feels good    02/11/2024    9:35 AM 01/31/2024    9:08 AM 10/17/2023   10:10 AM  Depression screen PHQ 2/9  Decreased Interest 0 0 0  Down, Depressed, Hopeless 0 0 0  PHQ - 2 Score 0 0 0  Altered sleeping  0   Tired, decreased energy  0   Change in appetite  1   Feeling bad or failure about yourself   0   Trouble concentrating  0   Moving slowly or fidgety/restless  0    Suicidal thoughts  0   PHQ-9 Score  1   Difficult doing work/chores  Not difficult at all     Hep C Screening:  done previously  STD testing and prevention (HIV/chl/gon/syphilis):   none needed   Intimate partner violence: safe at home   Advanced Care Planning:  A voluntary discussion about advance care planning including the explanation and discussion of advance directives.  Discussed health care proxy and Living will, and the patient was able to identify a health care proxy as wife.    Health Maintenance  Topic Date Due   Fecal DNA (Cologuard)  Never done   COVID-19 Vaccine (4 - 2024-25 season) 02/26/2024 (Originally 02/24/2023)   Zoster Vaccines- Shingrix (1 of 2) 05/12/2024 (Originally 09/24/2007)   Pneumococcal Vaccine: 50+ Years (1 of 1 - PCV) 06/11/2024 (Originally 09/24/2007)   INFLUENZA VACCINE  09/22/2024 (Originally 01/24/2024)   Medicare Annual Wellness (AWV)  01/30/2025   DTaP/Tdap/Td (2 - Td or Tdap) 04/08/2028   Hepatitis C Screening  Completed   HPV VACCINES  Aged Out   Meningococcal B Vaccine  Aged Out   Skin cancer: back itching, unknown if there is a rash/lesions  last skin survey was.  Pt reports no hx of skin cancer, suspicious lesions/biopsies in the past.  Colorectal cancer:  colonoscopy is due -0 doing cologuard    Prostate cancer:  Prostate  cancer screening with PSA: Discussed risks and benefits of PSA testing and provided handout. Pt will have PSA drawn today. Lab Results  Component Value Date   PSA 2.77 02/07/2023   PSA 1.3 07/17/2016    Urinary Symptoms:   IPSS     Row Name 02/11/24 9051         International Prostate Symptom Score   How often have you had the sensation of not emptying your bladder? Not at All     How often have you had to urinate less than every two hours? Not at All     How often have you found you stopped and started again several times when you urinated? Not at All     How often have you found it difficult to postpone  urination? Not at All     How often have you had a weak urinary stream? Not at All     How often have you had to strain to start urination? Not at All     How many times did you typically get up at night to urinate? 1 Time     Total IPSS Score 1        Lung cancer:   Low Dose CT Chest recommended if Age 60-80 years, 20 pack-year currently smoking OR have quit w/in 15years. Patient does not qualify.   Social History   Tobacco Use   Smoking status: Never   Smokeless tobacco: Never  Substance Use Topics   Alcohol use: No    Alcohol/week: 0.0 standard drinks of alcohol    Comment: quit 30 years ago     Alcohol screening: Flowsheet Row Clinical Support from 01/31/2024 in [facility]  AUDIT-C Score 0    AAA:  The USPSTF recommends one-time screening with ultrasonography in men ages 36 to 57 years who have ever smoked  ECG: none indicated   Blood pressure/Hypertension: BP Readings from Last 3 Encounters:  02/11/24 124/78  01/31/24 124/76  10/17/23 124/76   Weight/Obesity: Wt Readings from Last 3 Encounters:  02/11/24 260 lb (117.9 kg)  01/31/24 260 lb (117.9 kg)  10/17/23 256 lb 11.2 oz (116.4 kg)   BMI Readings from Last 3 Encounters:  02/11/24 36.26 kg/m  01/31/24 36.26 kg/m  10/17/23 35.80 kg/m    Lipids:  Lab Results  Component Value Date   CHOL 141 10/17/2023   CHOL 142 02/07/2023   CHOL 169 06/13/2022   Lab Results  Component Value Date   HDL 50 10/17/2023   HDL 45 02/07/2023   HDL 51 06/13/2022   Lab Results  Component Value Date   LDLCALC 78 10/17/2023   LDLCALC 79 02/07/2023   LDLCALC 93 06/13/2022   Lab Results  Component Value Date   TRIG 52 10/17/2023   TRIG 101 02/07/2023   TRIG 150 (H) 06/13/2022   Lab Results  Component Value Date   CHOLHDL 2.8 10/17/2023   CHOLHDL 3.2 02/07/2023   CHOLHDL 3.3 06/13/2022   Lab Results  Component Value Date   LDLDIRECT 113 (H) 04/26/2021   Based on the results of lipid  panel his/her cardiovascular risk factor ( using Poole Cohort )  in the next 10 years is : The 10-year ASCVD risk score (Arnett DK, et al., 2019) is: 14.3%   Values used to calculate the score:     Age: 66 years     Clincally relevant sex: Male     Is Non-Hispanic African American: Yes  Diabetic: No     Tobacco smoker: No     Systolic Blood Pressure: 124 mmHg     Is BP treated: Yes     HDL Cholesterol: 50 mg/dL     Total Cholesterol: 141 mg/dL Glucose:  Glucose, Bld  Date Value Ref Range Status  10/17/2023 103 (H) 65 - 99 mg/dL Final    Comment:    .            Fasting reference interval . For someone without known diabetes, a glucose value between 100 and 125 mg/dL is consistent with prediabetes and should be confirmed with a follow-up test. .   02/07/2023 100 (H) 65 - 99 mg/dL Final    Comment:    .            Fasting reference interval . For someone without known diabetes, a glucose value between 100 and 125 mg/dL is consistent with prediabetes and should be confirmed with a follow-up test. .   06/13/2022 102 (H) 65 - 99 mg/dL Final    Comment:    .            Fasting reference interval . For someone without known diabetes, a glucose value between 100 and 125 mg/dL is consistent with prediabetes and should be confirmed with a follow-up test. .     Social History       Social History   Socioeconomic History   Marital status: Married    Spouse name: Not on file   Number of children: Not on file   Years of education: Not on file   Highest education level: Not on file  Occupational History   Not on file  Tobacco Use   Smoking status: Never   Smokeless tobacco: Never  Vaping Use   Vaping status: Never Used  Substance and Sexual Activity   Alcohol use: No    Alcohol/week: 0.0 standard drinks of alcohol    Comment: quit 30 years ago   Drug use: No   Sexual activity: Yes    Partners: Female  Other Topics Concern   Not on file  Social History  Narrative   Not on file   Social Drivers of Health   Financial Resource Strain: Low Risk  (01/31/2024)   Overall Financial Resource Strain (CARDIA)    Difficulty of Paying Living Expenses: Not hard at all  Food Insecurity: No Food Insecurity (01/31/2024)   Hunger Vital Sign    Worried About Running Out of Food in the Last Year: Never true    Ran Out of Food in the Last Year: Never true  Transportation Needs: No Transportation Needs (01/31/2024)   PRAPARE - Administrator, Civil Service (Medical): No    Lack of Transportation (Non-Medical): No  Physical Activity: Sufficiently Active (01/31/2024)   Exercise Vital Sign    Days of Exercise per Week: 7 days    Minutes of Exercise per Session: 60 min  Stress: No Stress Concern Present (01/31/2024)   [employer] of Occupational Health - Occupational Stress Questionnaire    Feeling of Stress: Only a little  Social Connections: Socially Integrated (01/31/2024)   Social Connection and Isolation Panel    Frequency of Communication with Friends and Family: More than three times a week    Frequency of Social Gatherings with Friends and Family: More than three times a week    Attends Religious Services: More than 4 times per year    Active Member of Clubs or  Organizations: Yes    Attends Engineer, structural: More than 4 times per year    Marital Status: Married    Family History       History reviewed. No pertinent family history.  Patient Active Problem List   Diagnosis Date Noted   Gastroesophageal reflux disease 10/26/2021   Umbilical hernia without obstruction and without gangrene 06/09/2020   Osteoarthritis of right knee 06/09/2020   Class 2 severe obesity with serious comorbidity and body mass index (BMI) of 37.0 to 37.9 in adult [facility]) 05/14/2018   Prediabetes 04/17/2016   OSA (obstructive sleep apnea) 05/02/2015   Essential hypertension 05/02/2015   Hyperlipidemia 05/02/2015    Past Surgical History:   Procedure Laterality Date   NO PAST SURGERIES       Current Outpatient Medications:    olmesartan -hydrochlorothiazide (BENICAR  HCT) 40-25 MG tablet, Take 1 tablet by mouth daily., Disp: 90 tablet, Rfl: 1   omeprazole  (PRILOSEC) 20 MG capsule, Take 20 mg by mouth daily as needed., Disp: , Rfl:    rosuvastatin  (CRESTOR ) 10 MG tablet, TAKE 1 TABLET BY MOUTH EVERY DAY, Disp: 90 tablet, Rfl: 1  No Known Allergies  Patient Care Team: Leavy Mole, PA-C as PCP - General (Family Medicine)   Chart Review: I personally reviewed active problem list, medication list, allergies, family history, social history, health maintenance, notes from last encounter, lab results, imaging with the patient/caregiver today.   Review of Systems        Objective:   Vitals:  Vitals:   02/11/24 0937  BP: 124/78  Pulse: 69  Resp: 16  SpO2: 97%  Weight: 260 lb (117.9 kg)  Height: 5' 11 (1.803 m)    Body mass index is 36.26 kg/m.  Physical Exam Constitutional:      General: He is not in acute distress.    Appearance: Normal appearance. He is well-developed. He is obese. He is not ill-appearing, toxic-appearing or diaphoretic.  HENT:     Head: Normocephalic and atraumatic.     Jaw: No trismus.     Right Ear: Tympanic membrane, ear canal and external ear normal. There is no impacted cerumen.     Left Ear: Tympanic membrane, ear canal and external ear normal. There is no impacted cerumen.     Nose: No mucosal edema, congestion or rhinorrhea.     Right Sinus: No maxillary sinus tenderness or frontal sinus tenderness.     Left Sinus: No maxillary sinus tenderness or frontal sinus tenderness.     Mouth/Throat:     Mouth: Mucous membranes are moist.     Pharynx: Oropharynx is clear. Uvula midline. No oropharyngeal exudate, posterior oropharyngeal erythema or uvula swelling.  Eyes:     General: Lids are normal. No scleral icterus.       Right eye: No discharge.        Left eye: No discharge.      Conjunctiva/sclera: Conjunctivae normal.  Neck:     Trachea: Trachea and phonation normal. No tracheal deviation.  Cardiovascular:     Rate and Rhythm: Normal rate and regular rhythm.     Pulses: Normal pulses.          Radial pulses are 2+ on the right side and 2+ on the left side.       Posterior tibial pulses are 2+ on the right side and 2+ on the left side.     Heart sounds: Normal heart sounds. No murmur heard.    No friction rub.  No gallop.  Pulmonary:     Effort: Pulmonary effort is normal.     Breath sounds: Normal breath sounds. No stridor. No wheezing, rhonchi or rales.  Abdominal:     General: Bowel sounds are normal. There is no distension.     Palpations: Abdomen is soft.     Tenderness: There is no abdominal tenderness. There is no guarding or rebound.  Musculoskeletal:     Cervical back: Normal range of motion and neck supple.  Skin:    General: Skin is warm and dry.     Capillary Refill: Capillary refill takes less than 2 seconds.     Findings: Rash (faint hyperpigmented scattered rash to mid back between shoulder blades) present.  Neurological:     Mental Status: He is alert. Mental status is at baseline.     Gait: Gait normal.  Psychiatric:        Mood and Affect: Mood normal.        Speech: Speech normal.        Behavior: Behavior normal.      No results found for this or any previous visit (from the past 2160 hours).  Fall Risk:    02/11/2024    9:35 AM 01/31/2024    9:08 AM 10/17/2023   10:10 AM 06/12/2023   10:11 AM 02/07/2023   10:34 AM  Fall Risk   Falls in the past year? 0 0 0 0 0  Number falls in past yr: 0 0 0  0  Injury with Fall? 0 0 0  0  Risk for fall due to : No Fall Risks No Fall Risks No Fall Risks No Fall Risks   Follow up Falls prevention discussed Falls evaluation completed Falls evaluation completed Falls prevention discussed     Functional Status Survey: Is the patient deaf or have difficulty hearing?: No Does the patient have  difficulty seeing, even when wearing glasses/contacts?: No Does the patient have difficulty concentrating, remembering, or making decisions?: No Does the patient have difficulty walking or climbing stairs?: No Does the patient have difficulty dressing or bathing?: No Does the patient have difficulty doing errands alone such as visiting a doctor's office or shopping?: No   Assessment & Plan:    CPE completed today  Prostate cancer screening and PSA options (with potential risks and benefits of testing vs not testing) were discussed along with recent recs/guidelines, shared decision making and handout/information given to pt today  USPSTF grade A and B recommendations reviewed with patient; age-appropriate recommendations, preventive care, screening tests, etc discussed and encouraged; healthy living encouraged; see AVS for patient education given to patient  Discussed importance of 150 minutes of physical activity weekly, AHA exercise recommendations given to pt in AVS/handout  Discussed importance of healthy diet:  eating lean meats and proteins, avoiding trans fats and saturated fats, avoid simple sugars and excessive carbs in diet, eat 6 servings of fruit/vegetables daily and drink plenty of water and avoid sweet beverages.  DASH diet reviewed if pt has HTN  Recommended pt to do annual eye exam and routine dental exams/cleanings  Advance Care planning information and packet discussed and offered today, encouraged pt to discuss with family members/spouse/partner/friends and complete Advanced directive packet and bring copy to office   Reviewed Health Maintenance: Health Maintenance  Topic Date Due   Fecal DNA (Cologuard)  Never done   COVID-19 Vaccine (4 - 2024-25 season) 02/26/2024 (Originally 02/24/2023)   Zoster Vaccines- Shingrix (1 of 2) 05/12/2024 (Originally 09/24/2007)  Pneumococcal Vaccine: 50+ Years (1 of 1 - PCV) 06/11/2024 (Originally 09/24/2007)   INFLUENZA VACCINE  09/22/2024  (Originally 01/24/2024)   Medicare Annual Wellness (AWV)  01/30/2025   DTaP/Tdap/Td (2 - Td or Tdap) 04/08/2028   Hepatitis C Screening  Completed   HPV VACCINES  Aged Out   Meningococcal B Vaccine  Aged Out    Immunizations: Immunization History  Administered Date(s) Administered   Fluad Quad(high Dose 65+) 04/01/2023   Influenza,inj,Quad PF,6+ Mos 05/02/2015, 04/08/2018, 04/28/2019, 05/11/2020, 04/10/2021   Influenza-Unspecified 04/02/2016   PFIZER(Purple Top)SARS-COV-2 Vaccination 08/26/2019, 09/28/2019, 04/14/2020   Tdap 04/08/2018   Vaccines:  HPV: up to at age 68 , ask insurance if age between 48-45  Shingrix: 65-64 yo and ask insurance if covered when patient above 16 yo Pneumonia:  educated and discussed with patient. Flu: recommend in Oct educated and discussed with patient.      ICD-10-CM   1. Well adult exam  Z00.00 Comprehensive Metabolic Panel (CMET)    PSA    CBC with Differential/Platelet    Hemoglobin A1c    Lipid panel    2. Hyperlipidemia, unspecified hyperlipidemia type  E78.5    on crestor , good med compliance and no SE or concerns, encouraged to improve diet/lifestyle and continue med, will do lipids with CPE annually    3. Essential hypertension  I10    stable well controlled today on current meds, labs done and meds refilled    4. OSA (obstructive sleep apnea)  G47.33    retested years ago states he no longer needed CPAP    5. Prediabetes  R73.03    recheck    6. Gastroesophageal reflux disease, unspecified whether esophagitis present  K21.9    well controlled with omeprazole     7. Class 2 severe obesity with serious comorbidity and body mass index (BMI) of 37.0 to 37.9 in adult, unspecified obesity type (HCC)  Z33.187 Comprehensive Metabolic Panel (CMET)   E66.01 CBC with Differential/Platelet   Z68.37 Hemoglobin A1c    Lipid panel    8. Screening for malignant neoplasm of prostate  Z12.5 PSA   minimal sx, labs last year, with trend, shared  decision making for PSA/prostate cancer screening options    9. Screening for malignant neoplasm of colon  Z12.11 Cologuard   needs new cologuard kit    10. Rash  R21    on back some itching intermittent over years - he will send msg from home with what med Dr. Harrie used to give him    42. Left hand pain  M79.642 Ambulatory referral to Hand Surgery   left hand dominant man pain to left 4th MCP joint over a month ago has not improved, wishes to consult ortho/hand     Return in about 6 months (around 08/13/2024) for Routine follow-up.      Michelene Cower, PA-C 02/11/24 10:10 AM  [facility]

## 2024-02-12 LAB — COMPREHENSIVE METABOLIC PANEL WITH GFR
AG Ratio: 1.5 (calc) (ref 1.0–2.5)
ALT: 17 U/L (ref 9–46)
AST: 17 U/L (ref 10–35)
Albumin: 4 g/dL (ref 3.6–5.1)
Alkaline phosphatase (APISO): 58 U/L (ref 35–144)
BUN: 12 mg/dL (ref 7–25)
CO2: 29 mmol/L (ref 20–32)
Calcium: 9.4 mg/dL (ref 8.6–10.3)
Chloride: 103 mmol/L (ref 98–110)
Creat: 0.91 mg/dL (ref 0.70–1.35)
Globulin: 2.7 g/dL (ref 1.9–3.7)
Glucose, Bld: 98 mg/dL (ref 65–99)
Potassium: 4 mmol/L (ref 3.5–5.3)
Sodium: 139 mmol/L (ref 135–146)
Total Bilirubin: 0.4 mg/dL (ref 0.2–1.2)
Total Protein: 6.7 g/dL (ref 6.1–8.1)
eGFR: 93 mL/min/1.73m2 (ref >=60)

## 2024-02-12 LAB — LIPID PANEL
Cholesterol: 160 mg/dL (ref <200)
HDL: 52 mg/dL (ref >=40)
LDL Cholesterol (Calc): 91 mg/dL
Non-HDL Cholesterol (Calc): 108 mg/dL (ref <130)
Total CHOL/HDL Ratio: 3.1 (calc) (ref <5.0)
Triglycerides: 77 mg/dL (ref <150)

## 2024-02-12 LAB — CBC WITH DIFFERENTIAL/PLATELET
Absolute Lymphocytes: 1764 {cells}/uL (ref 850–3900)
Absolute Monocytes: 546 {cells}/uL (ref 200–950)
Basophils Absolute: 30 {cells}/uL (ref 0–200)
Basophils Relative: 0.5 %
Eosinophils Absolute: 42 {cells}/uL (ref 15–500)
Eosinophils Relative: 0.7 %
HCT: 45.4 % (ref 38.5–50.0)
Hemoglobin: 14.4 g/dL (ref 13.2–17.1)
MCH: 26.7 pg — ABNORMAL LOW (ref 27.0–33.0)
MCHC: 31.7 g/dL — ABNORMAL LOW (ref 32.0–36.0)
MCV: 84.1 fL (ref 80.0–100.0)
MPV: 11.6 fL (ref 7.5–12.5)
Monocytes Relative: 9.1 %
Neutro Abs: 3618 {cells}/uL (ref 1500–7800)
Neutrophils Relative %: 60.3 %
Platelets: 206 Thousand/uL (ref 140–400)
RBC: 5.4 Million/uL (ref 4.20–5.80)
RDW: 12.9 % (ref 11.0–15.0)
Total Lymphocyte: 29.4 %
WBC: 6 Thousand/uL (ref 3.8–10.8)

## 2024-02-12 LAB — HEMOGLOBIN A1C
Hgb A1c MFr Bld: 6.3 % — ABNORMAL HIGH (ref <5.7)
Mean Plasma Glucose: 134 mg/dL
eAG (mmol/L): 7.4 mmol/L

## 2024-02-12 LAB — PSA: PSA: 2.77 ng/mL (ref <=4.00)

## 2024-02-13 ENCOUNTER — Other Ambulatory Visit: Payer: Self-pay | Admitting: Family Medicine

## 2024-02-14 NOTE — Telephone Encounter (Signed)
 Requested medication (s) are due for refill today: routing for review  Requested medication (s) are on the active medication list: no  Last refill:  03/02/23  Future visit scheduled: yes  Notes to clinic:  Unable to refill per protocol, historical medication.     Requested Prescriptions  Pending Prescriptions Disp Refills   omeprazole  (PRILOSEC) 20 MG capsule [Pharmacy Med Name: OMEPRAZOLE  DR 20 MG CAPSULE] 90 capsule 1    Sig: TAKE 1 CAPSULE BY MOUTH EVERY DAY AS NEEDED     Gastroenterology: Proton Pump Inhibitors Passed - 02/14/2024  2:16 PM      Passed - Valid encounter within last 12 months    Recent Outpatient Visits           3 days ago Well adult exam   [facility] Leavy Mole, PA-C   4 months ago OSA (obstructive sleep apnea)   [facility] Leavy Mole, PA-C       Future Appointments             In 6 months Leavy Mole, PA-C [facility], [facility]

## 2024-02-18 NOTE — Telephone Encounter (Unsigned)
 Copied from CRM #[id number]. Topic: Clinical - Medication Refill >> Feb 18, 2024  1:55 PM Kathrin PARAS wrote: Medication:  omeprazole  (PRILOSEC) 20 MG capsule   Has the patient contacted their pharmacy? Yes (Agent: If no, request that the patient contact the pharmacy for the refill. If patient does not wish to contact the pharmacy document the reason why and proceed with request.) (Agent: If yes, when and what did the pharmacy advise?)  This is the patient's preferred pharmacy:  [id number] - [place], [place] - [address] [place] [address] Phone: [phone] Fax: [phone]   Is this the correct pharmacy for this prescription? Yes If no, delete pharmacy and type the correct one.   Has the prescription been filled recently? Yes  Is the patient out of the medication? Yes  Has the patient been seen for an appointment in the last year OR does the patient have an upcoming appointment? Yes  Can we respond through MyChart? Yes  Agent: Please be advised that Rx refills may take up to 3 business days. We ask that you follow-up with your pharmacy.

## 2024-02-19 ENCOUNTER — Ambulatory Visit: Payer: Self-pay | Admitting: Family Medicine

## 2024-02-19 MED ORDER — OMEPRAZOLE 40 MG PO CPDR: 40.0000 mg | DELAYED_RELEASE_CAPSULE | Freq: Every day | ORAL | 1 refills | Status: AC

## 2024-03-06 NOTE — Telephone Encounter (Signed)
 Copied from CRM #[id number]. Topic: Clinical - Prescription Issue >> Mar 06, 2024  1:34 PM Gustabo D wrote: Covid booster prescription sent to [facility] [place] [address] [id number]

## 2024-03-10 ENCOUNTER — Ambulatory Visit: Admitting: Orthopedic Surgery

## 2024-04-27 ENCOUNTER — Encounter: Payer: Self-pay | Admitting: Radiology

## 2024-07-22 ENCOUNTER — Telehealth: Payer: Self-pay

## 2024-07-22 NOTE — Telephone Encounter (Signed)
 Pharmacy Quality Measure Review  This patient is appearing on a report for being at risk of failing the adherence measure for cholesterol (statin) and hypertension (ACEi/ARB) medications this calendar year.   Medication: olmesartan /hydrochlorothiazide  Last fill date: 04/13/24 for 90 day supply  Medication: rosuvastatin  Last fill date: 05/14/24 for 90 day supply  Patient is due for follow up in office at this time. No upcoming appointments on file. Will collaborate with clinic admin team for rescheduling purposes.  Derrisha Foos E. Marsh, PharmD, BCACP, CPP Clinical Pharmacist [facility] [phone]

## 2024-07-24 NOTE — Telephone Encounter (Signed)
 Lvm for pt to call back and schedule for a TOC with Brittany Wall

## 2024-07-24 NOTE — Telephone Encounter (Unsigned)
 Copied from CRM #[id number]. Topic: Clinical - Medical Advice >> Jul 24, 2024  1:31 PM [name] wrote: Reason for CRM: Patient requesting call back from Nurse [facility].. he had a few question.[name] He said it isn't pressing

## 2024-07-24 NOTE — Telephone Encounter (Signed)
 Appointment schedule

## 2024-08-18 ENCOUNTER — Ambulatory Visit: Admitting: Family Medicine

## 2024-09-22 ENCOUNTER — Encounter: Admitting: Internal Medicine

## 2024-10-16 ENCOUNTER — Encounter: Admitting: Internal Medicine
